# Patient Record
Sex: Female | Born: 2000
Health system: Southern US, Community
[De-identification: ages and names within clinical notes are randomized; demographics above are authoritative.]

## PROBLEM LIST (undated history)

## (undated) DIAGNOSIS — F329 Major depressive disorder, single episode, unspecified: Secondary | ICD-10-CM

## (undated) DIAGNOSIS — F32A Depression, unspecified: Secondary | ICD-10-CM

## (undated) DIAGNOSIS — F319 Bipolar disorder, unspecified: Secondary | ICD-10-CM

## (undated) DIAGNOSIS — F419 Anxiety disorder, unspecified: Secondary | ICD-10-CM

## (undated) DIAGNOSIS — Z7289 Other problems related to lifestyle: Secondary | ICD-10-CM

---
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
## 2021-02-22 IMAGING — MR MR HEAD W/O CM
12 of 13 series · 44 of 48 positions shown · non-contrast
Comparison: None available.

CLINICAL DATA: Initial evaluation for acute altered mental status.
Unclear cause.

EXAM:
MRI HEAD WITHOUT CONTRAST
TECHNIQUE: Multiplanar, multiecho pulse sequences of the brain and surrounding
structures were obtained without intravenous contrast.

[Series 5: DWI · axial · 3.0mm · 0.88mm/px · z∈[-82,+63]mm · 8 of 100 slices shown (1 of 4)]
[im 1/100]
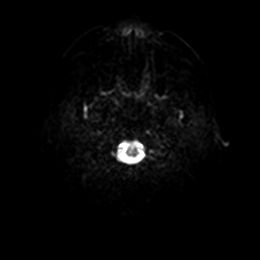
[im 15/100]
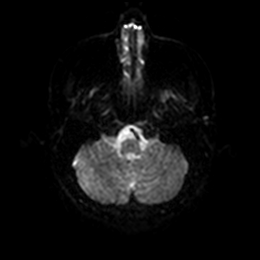
[im 29/100]
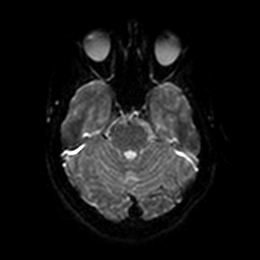
[im 43/100]
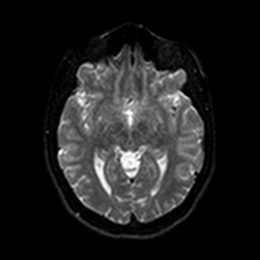
[im 57/100]
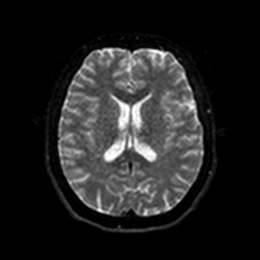
[im 71/100]
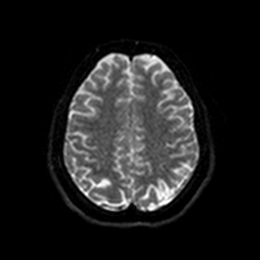
[im 85/100]
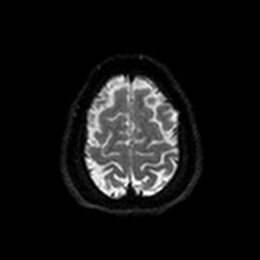
[im 100/100]
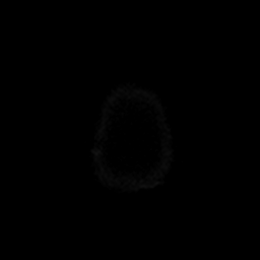

[Series 6: DWI · axial · 3.0mm · 0.88mm/px · z∈[-82,+63]mm · 4 of 50 slices shown (2 of 4)]
[im 1/50]
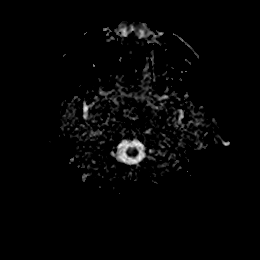
[im 17/50]
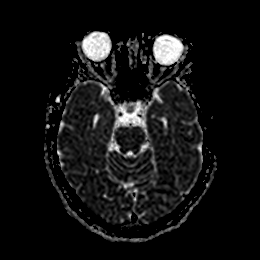
[im 33/50]
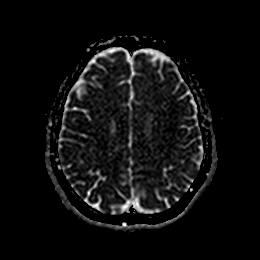
[im 50/50]
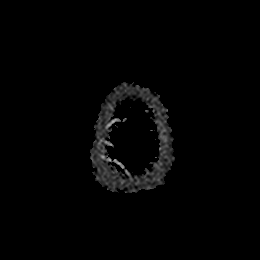

[Series 7: PD · axial · 4.0mm · 0.66mm/px · z∈[-84,+63]mm · 3 of 32 slices shown]
[im 1/32]
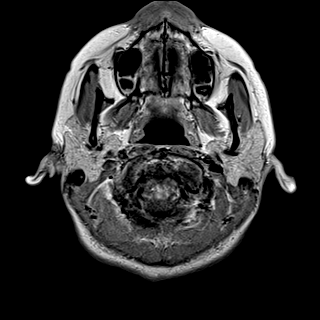
[im 16/32]
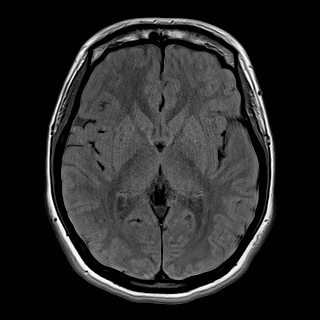
[im 32/32]
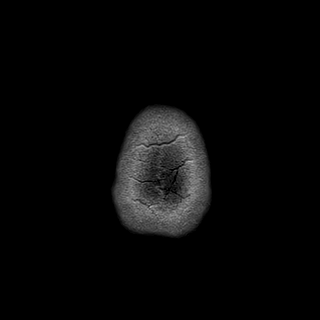

[Series 8: DWI · coronal · 4.0mm · 0.88mm/px · 6 of 72 slices shown (3 of 4)]
[im 1/72]
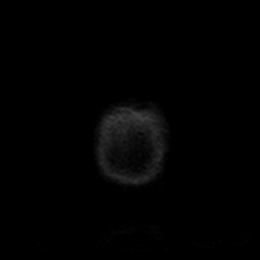
[im 15/72]
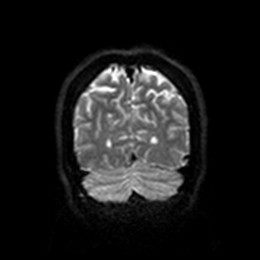
[im 29/72]
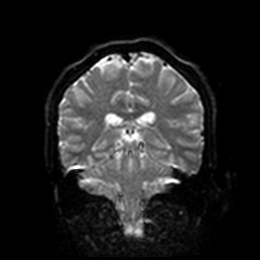
[im 43/72]
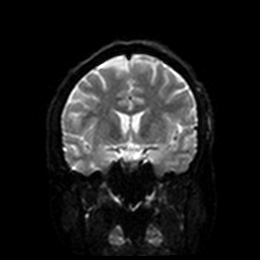
[im 57/72]
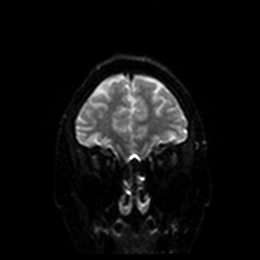
[im 72/72]
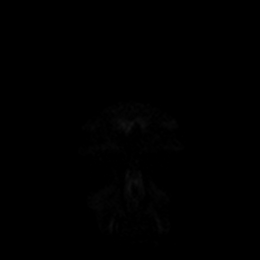

[Series 9: DWI · coronal · 4.0mm · 0.88mm/px · 3 of 36 slices shown (4 of 4)]
[im 1/36]
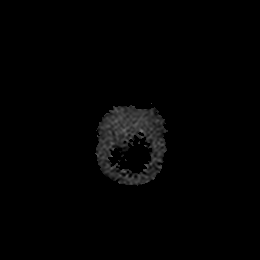
[im 18/36]
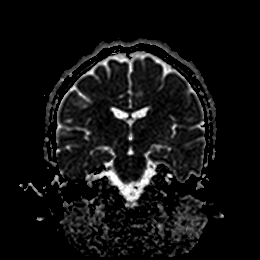
[im 36/36]
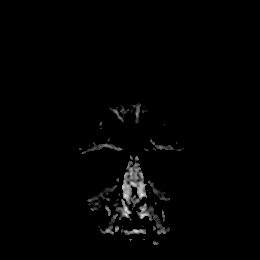

[Series 10: T1 · sagittal · 4.0mm · 0.75mm/px · 2 of 29 slices shown]
[im 1/29]
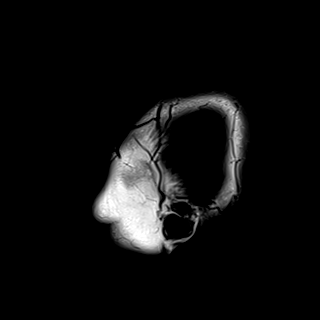
[im 29/29]
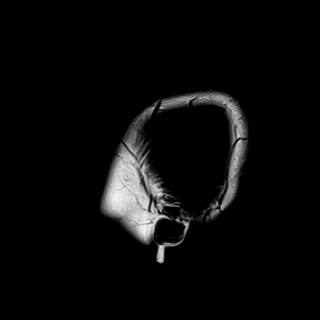

[Series 11: T2 · axial · 5.0mm · 0.72mm/px · z∈[-81,+62]mm · 2 of 25 slices shown (1 of 2)]
[im 1/25]
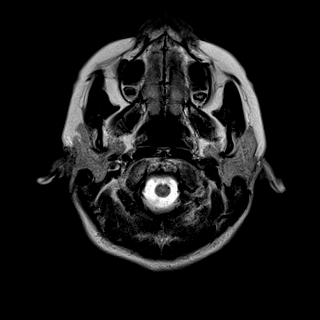
[im 25/25]
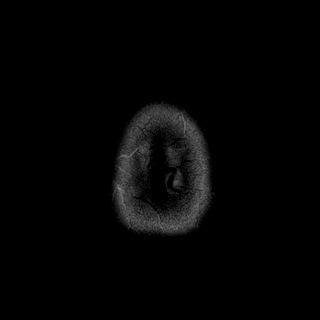

[Series 12: FLAIR · axial · 5.0mm · 0.45mm/px · z∈[-81,+61]mm · 2 of 25 slices shown]
[im 1/25]
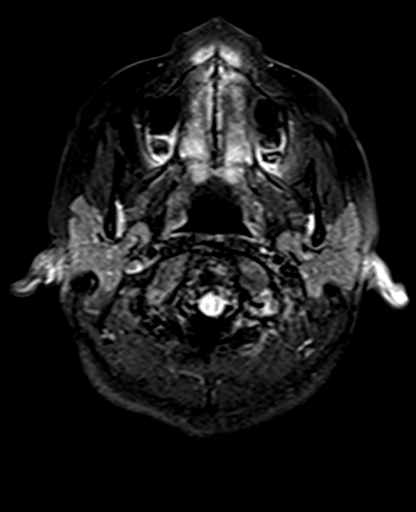
[im 25/25]
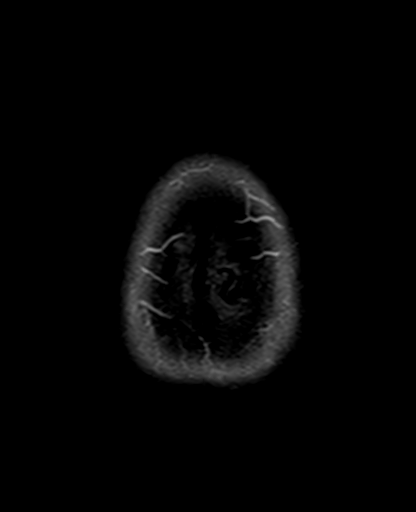

[Series 13: mag_images · axial · 3.0mm · 0.90mm/px · z∈[-86,+65]mm · 4 of 52 slices shown]
[im 1/52]
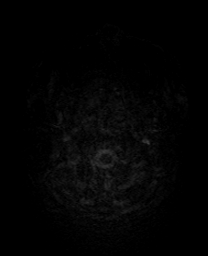
[im 18/52]
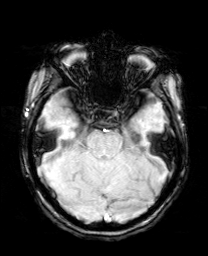
[im 35/52]
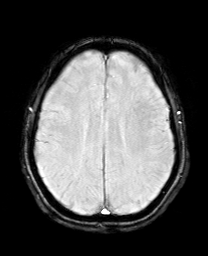
[im 52/52]
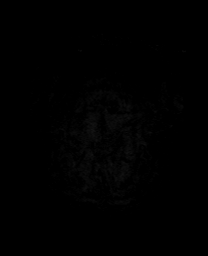

[Series 14: pha_images · axial · 3.0mm · 0.90mm/px · z∈[-83,+62]mm · 4 of 50 slices shown]
[im 1/50]
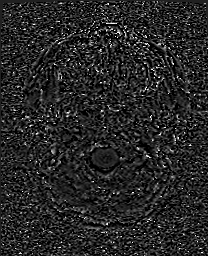
[im 17/50]
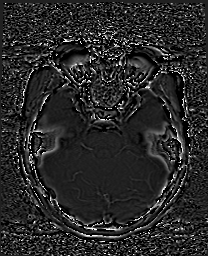
[im 33/50]
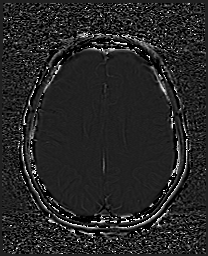
[im 50/50]
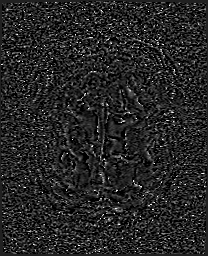

[Series 15: swi_images · axial · 3.0mm · 0.90mm/px · z∈[-86,+65]mm · 4 of 52 slices shown]
[im 1/52]
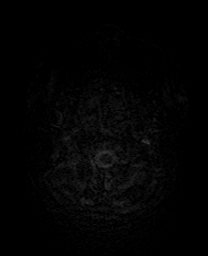
[im 18/52]
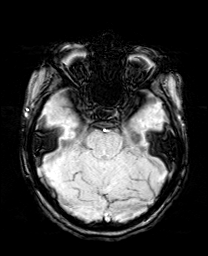
[im 35/52]
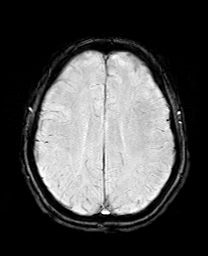
[im 52/52]
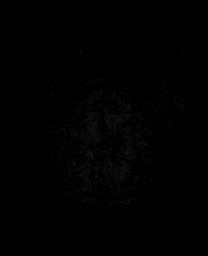

[Series 18: T2 · coronal · 5.0mm · 0.62mm/px · 2 of 29 slices shown (2 of 2)]
[im 1/29]
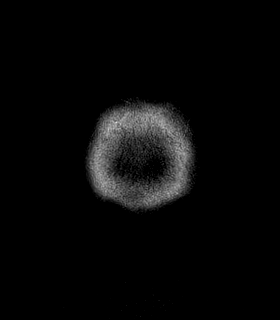
[im 29/29]
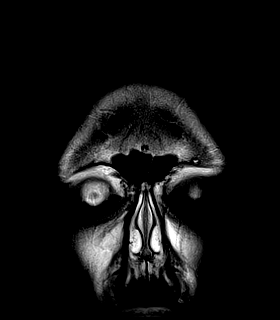

[44 of 48 positions shown; findings below may reference images not displayed]

FINDINGS: Brain: Cerebral volume within normal limits for patient age. No
focal parenchymal signal abnormality identified.

No abnormal foci of restricted diffusion to suggest acute or
subacute ischemia. Gray-white matter differentiation well
maintained. No encephalomalacia to suggest chronic infarction. No
foci of susceptibility artifact to suggest acute or chronic
intracranial hemorrhage.

No mass lesion, midline shift or mass effect. No hydrocephalus. No
extra-axial fluid collection. Major dural sinuses are grossly
patent.

Pituitary gland and suprasellar region are normal. Midline
structures intact and normal.

Vascular: Major intracranial vascular flow voids well maintained and
normal in appearance.

Skull and upper cervical spine: Craniocervical junction normal.
Visualized upper cervical spine within normal limits. Bone marrow
signal intensity normal. No scalp soft tissue abnormality.

Sinuses/Orbits: Globes and orbital soft tissues within normal
limits.

Paranasal sinuses are clear. No mastoid effusion. Inner ear
structures normal.

Other: None.
IMPRESSION: Normal brain MRI.  No acute intracranial abnormality.

## 2022-05-12 ENCOUNTER — Ambulatory Visit: Payer: Medicaid Other | Admitting: Occupational Therapy

## 2022-05-15 NOTE — Therapy (Incomplete)
OUTPATIENT OCCUPATIONAL THERAPY NEURO EVALUATION  Patient Name: Kara Nguyen MRN: [id number] DOB:2001-04-14, 21 y.o., female Today's Date: 05/15/2022  PCP: Marland Kitchen REFERRING PROVIDER: Juanda Chance     Past Medical History:  Diagnosis Date   Anxiety    Bipolar disorder [facility])    Deliberate self-cutting    Depression    No past surgical history on file. Patient Active Problem List   Diagnosis Date Noted   Abnormal movements 11/06/2019   Panic disorder 11/06/2019   Transient motor tic 11/06/2019   Vaginal discharge 04/22/2017   Anorexia nervosa, binge-eating purging type 08/27/2015   Oligomenorrhea 08/26/2015   Vitamin D deficiency 08/26/2015   Iron deficiency anemia 08/19/2015   History of self-harm 08/19/2015    ONSET DATE: ~02/10/2022   REFERRING DIAG: M25.531,M25.532 (ICD-10-CM) - Bilateral wrist pain   THERAPY DIAG:  No diagnosis found.  Rationale for Evaluation and Treatment: {HABREHAB:27488}  SUBJECTIVE:   SUBJECTIVE STATEMENT: *** Pt accompanied by: {accompnied:27141}  PERTINENT HISTORY: Per orthopedic note, "Eliannah Borowicz is a 21 y.o. female who presents today with complaints of bilateral hand pain. Patient reports this is somewhat of a chronic problem. No history of injury. She is right-hand dominant. She reports her hands bother her most when she is drawing. She denies any numbness or tingling. No particular night pain. Pain is somewhat poorly distributed by description. It does not localize well. She was seen by her primary care provider and had rheumatologic labs performed which were normal with the exception of mild elevation of rheumatoid factor and she has a rheumatologic consultation pending. Patient has other sites of musculoskeletal pain as well. She has had a referral placed for occupational therapy as well."   PRECAUTIONS: {Therapy precautions:24002}  WEIGHT BEARING RESTRICTIONS: {Yes ***/No:24003}  PAIN:  Are you having pain?  {OPRCPAIN:27236}  FALLS: Has patient fallen in last 6 months? {fallsyesno:27318}  LIVING ENVIRONMENT: Lives with: {OPRC lives with:25569::"lives with their family"} Lives in: {Lives in:25570} Stairs: {opstairs:27293} Has following equipment at home: {Assistive devices:23999}  PLOF: {PLOF:24004}  PATIENT GOALS: ***  OBJECTIVE:   HAND DOMINANCE: {MISC; OT HAND DOMINANCE:[id number]}  ADLs: Overall ADLs: *** Transfers/ambulation related to ADLs: Eating: *** Grooming: *** UB Dressing: *** LB Dressing: *** Toileting: *** Bathing: *** Tub Shower transfers: *** Equipment: {equipment:25573}  IADLs: Shopping: *** Light housekeeping: *** Meal Prep: *** Community mobility: *** Medication management: *** Financial management: *** Handwriting: {OTWRITTENEXPRESSION:25361}  MOBILITY STATUS: {OTMOBILITY:25360}  POSTURE COMMENTS:  {posture:25561} Sitting balance: {sitting balance:25483}  ACTIVITY TOLERANCE: Activity tolerance: ***  FUNCTIONAL OUTCOME MEASURES: {OTFUNCTIONALMEASURES:27238}  UPPER EXTREMITY ROM:    {AROM/PROM:27142} ROM Right eval Left eval  Shoulder flexion    Shoulder abduction    Shoulder adduction    Shoulder extension    Shoulder internal rotation    Shoulder external rotation    Elbow flexion    Elbow extension    Wrist flexion    Wrist extension    Wrist ulnar deviation    Wrist radial deviation    Wrist pronation    Wrist supination    (Blank rows = not tested)  UPPER EXTREMITY MMT:     MMT Right eval Left eval  Shoulder flexion    Shoulder abduction    Shoulder adduction    Shoulder extension    Shoulder internal rotation    Shoulder external rotation    Middle trapezius    Lower trapezius    Elbow flexion    Elbow extension    Wrist flexion  Wrist extension    Wrist ulnar deviation    Wrist radial deviation    Wrist pronation    Wrist supination    (Blank rows = not tested)  HAND  FUNCTION: {handfunction:27230}  COORDINATION: {otcoordination:27237}  SENSATION: {sensation:27233}  EDEMA: ***  MUSCLE TONE: {UETONE:25567}  COGNITION: Overall cognitive status: {cognition:24006}  VISION: Subjective report: *** Baseline vision: {OTBASELINEVISION:25363} Visual history: {OTVISUALHISTORY:25364}  VISION ASSESSMENT: {visionassessment:27231}  Patient has difficulty with following activities due to following visual impairments: ***  PERCEPTION: {Perception:25564}  PRAXIS: {Praxis:25565}  OBSERVATIONS: ***   TODAY'S TREATMENT:                                                                                                                              DATE: ***   PATIENT EDUCATION: Education details: *** Person educated: {Person educated:25204} Education method: {Education Method:25205} Education comprehension: {Education Comprehension:25206}  HOME EXERCISE PROGRAM: ***   GOALS: Goals reviewed with patient? {yes/no:20286}  SHORT TERM GOALS: Target date: ***  *** Baseline: Goal status: {GOALSTATUS:25110}  2.  *** Baseline:  Goal status: {GOALSTATUS:25110}  3.  *** Baseline:  Goal status: {GOALSTATUS:25110}  4.  *** Baseline:  Goal status: {GOALSTATUS:25110}  5.  *** Baseline:  Goal status: {GOALSTATUS:25110}  6.  *** Baseline:  Goal status: {GOALSTATUS:25110}  LONG TERM GOALS: Target date: ***  *** Baseline:  Goal status: {GOALSTATUS:25110}  2.  *** Baseline:  Goal status: {GOALSTATUS:25110}  3.  *** Baseline:  Goal status: {GOALSTATUS:25110}  4.  *** Baseline:  Goal status: {GOALSTATUS:25110}  5.  *** Baseline:  Goal status: {GOALSTATUS:25110}  6.  *** Baseline:  Goal status: {GOALSTATUS:25110}  ASSESSMENT:  CLINICAL IMPRESSION: Patient is a *** y.o. *** who was seen today for occupational therapy evaluation for ***.   PERFORMANCE DEFICITS: in functional skills including {OT physical skills:25468},  cognitive skills including {OT cognitive skills:25469}, and psychosocial skills including {OT psychosocial skills:25470}.   IMPAIRMENTS: are limiting patient from {OT performance deficits:25471}.   CO-MORBIDITIES: {Comorbidities:25485} that affects occupational performance. Patient will benefit from skilled OT to address above impairments and improve overall function.  MODIFICATION OR ASSISTANCE TO COMPLETE EVALUATION: {OT modification:25474}  OT OCCUPATIONAL PROFILE AND HISTORY: {OT PROFILE AND HISTORY:25484}  CLINICAL DECISION MAKING: {OT CDM:25475}  REHAB POTENTIAL: {rehabpotential:25112}  EVALUATION COMPLEXITY: {Evaluation complexity:25115}    PLAN:  OT FREQUENCY: {rehab frequency:25116}  OT DURATION: {rehab duration:25117}  PLANNED INTERVENTIONS: {OT Interventions:25467}  RECOMMENDED OTHER SERVICES: ***  CONSULTED AND AGREED WITH PLAN OF CARE: {IEP:32951}  PLAN FOR NEXT SESSION: Dennis Bast, OT 05/15/2022, 4:09 PM

## 2022-05-18 ENCOUNTER — Encounter: Payer: Medicaid Other | Admitting: Occupational Therapy

## 2022-05-18 ENCOUNTER — Ambulatory Visit: Payer: Medicaid Other | Attending: Physician Assistant | Admitting: Occupational Therapy

## 2022-05-18 ENCOUNTER — Encounter: Payer: Self-pay | Admitting: Occupational Therapy

## 2022-05-18 DIAGNOSIS — M25532 Pain in left wrist: Secondary | ICD-10-CM | POA: Diagnosis present

## 2022-05-18 DIAGNOSIS — M25531 Pain in right wrist: Secondary | ICD-10-CM | POA: Insufficient documentation

## 2022-05-18 NOTE — Therapy (Signed)
OUTPATIENT OCCUPATIONAL THERAPY ORTHO EVALUATION  Patient Name: Kara Nguyen MRN: [id number] DOB:Nov 09, 2000, 21 y.o., female Today's Date: 05/18/2022  PCP: Juanda Chance REFERRING PROVIDER: Mindi Curling, PA-C   OT End of Session - 05/18/22 1404     Visit Number 1    Number of Visits 1    Authorization Type [employer]    OT Start Time 9449    OT Stop Time 1350    OT Time Calculation (min) 45 min    Activity Tolerance Patient tolerated treatment well    Behavior During Therapy WFL for tasks assessed/performed             Past Medical History:  Diagnosis Date   Anxiety    Bipolar disorder ([place])    Deliberate self-cutting    Depression    History reviewed. No pertinent surgical history. Patient Active Problem List   Diagnosis Date Noted   Abnormal movements 11/06/2019   Panic disorder 11/06/2019   Transient motor tic 11/06/2019   Vaginal discharge 04/22/2017   Anorexia nervosa, binge-eating purging type 08/27/2015   Oligomenorrhea 08/26/2015   Vitamin D deficiency 08/26/2015   Iron deficiency anemia 08/19/2015   History of self-harm 08/19/2015    ONSET DATE: 05/04/2022  REFERRING DIAG: M25.531,M25.532 (ICD-10-CM) - Bilateral wrist pain  THERAPY DIAG:  Pain in right wrist  Pain in left wrist  Rationale for Evaluation and Treatment: Rehabilitation  SUBJECTIVE:   SUBJECTIVE STATEMENT: This began around August after editing on my phone and drawing a lot, however I feel like stress may have also contributed to this Pt accompanied by: family member  PERTINENT HISTORY: negative workup for nerve compressions, fx's, tendonitis however does have pending rheumatologic consult for mild elevation of rheumatoid factor. PMH: anxiety, bipolar, depression.  PRECAUTIONS: None  WEIGHT BEARING RESTRICTIONS: No  PAIN:  Are you having pain? Yes: NPRS scale: 0-6/10 in past 2 weeks, no pain reported today Pain location: base of thumbs/wrists Pain  description: sore/achy Aggravating factors: drawing, editing on phone Relieving factors: not using hands, ice  FALLS: Has patient fallen in last 6 months? 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  PATIENT GOALS: decrease pain in hands/wrists  NEXT MD VISIT:   OBJECTIVE:   HAND DOMINANCE: Right  ADLs: Overall ADLs: independent but pain with brushing hair  FUNCTIONAL OUTCOME MEASURES: Quick Dash: 13.6% impairment  UPPER EXTREMITY ROM:   BUE AROM WFL's including intrinsics, but noted laxity of joints in bilateral hands  Grip strength: Right: 55.7 lbs; Left: 48.0 lbs, Lateral pinch: Right: 15 lbs, Left: 14 lbs, and 3 point pinch: Right: 12 lbs, Left: 10 lbs  COORDINATION: 9 Hole Peg test: Right: 21.94 sec; Left: 26.16 sec  SENSATION: intact  EDEMA: none  COGNITION: Overall cognitive status: Within functional limits for tasks assessed. Pt however does ask multiple questions surrounding pain  OBSERVATIONS: Pt reports more stress lately which could be contributing to pain. Noted laxity of joints bilateral hands (suspect this is normal for her)   TODAY'S TREATMENT:  DATE: 05/18/22  Pt educated on positioning when writing/drawing including body positioning and positioning tablet in better position. Issued tan foam for built up pencil. Also recommended mechanical pencil as she would not have to press as hard. Discussed how laxity of joints is not uncommon for young women, but may make joints slightly less stable.   Recommended following up w/ rheumatologist to r/o any other factors, however do not see any current symptoms that are concerning.    PATIENT EDUCATION: See above  HOME EXERCISE PROGRAM: N/A  GOALS: Goals reviewed with patient? Yes   LONG  TERM GOALS: Target date:  05/18/22     Pt to verbalize understanding with positioning and A/E to reduce pain with drawing/writing Baseline:  Goal status: MET  ASSESSMENT:  CLINICAL IMPRESSION: Patient is a 21 y.o. female who was seen today for occupational therapy evaluation for bilateral wrist/hand pain which started around August after drawing and editing a lot. Pt scores WFL's for all tests today including grip and pinch strength, and coordination. Pt also reports pain is getting better. Pt was issued A/E for pencil and discussed positioning for pain reduction. No f/u O.T. recommended at this time  PERFORMANCE DEFICITS: in functional skills including pain, UE functional use.   IMPAIRMENTS: are limiting patient from leisure.   COMORBIDITIES: has no other co-morbidities that affects occupational performance. Patient will benefit from skilled OT to address above impairments and improve overall function.  MODIFICATION OR ASSISTANCE TO COMPLETE EVALUATION: Min-Moderate modification of tasks or assist with assess necessary to complete an evaluation.  OT OCCUPATIONAL PROFILE AND HISTORY: Problem focused assessment: Including review of records relating to presenting problem.  CLINICAL DECISION MAKING: LOW - limited treatment options, no task modification necessary  REHAB POTENTIAL: Good  EVALUATION COMPLEXITY: Low      PLAN:  OT FREQUENCY: one time visit    PLANNED INTERVENTIONS: coping strategies training and DME and/or AE instructions  RECOMMENDED OTHER SERVICES: Rheumatology  CONSULTED AND AGREED WITH PLAN OF CARE: Patient  PLAN FOR NEXT SESSION: N/A - No f/u O.T. recommended   Hans Eden, OT 05/18/2022, 2:05 PM

## 2022-10-30 ENCOUNTER — Ambulatory Visit (INDEPENDENT_AMBULATORY_CARE_PROVIDER_SITE_OTHER): Payer: Medicaid Other

## 2022-10-30 ENCOUNTER — Encounter (HOSPITAL_COMMUNITY): Payer: Self-pay

## 2022-10-30 ENCOUNTER — Ambulatory Visit (HOSPITAL_COMMUNITY)
Admission: EM | Admit: 2022-10-30 | Discharge: 2022-10-30 | Disposition: A | Discharge status: Home / Self Care | Payer: Medicaid Other | Attending: Urgent Care | Admitting: Urgent Care

## 2022-10-30 ENCOUNTER — Other Ambulatory Visit: Payer: Self-pay

## 2022-10-30 DIAGNOSIS — F419 Anxiety disorder, unspecified: Secondary | ICD-10-CM | POA: Insufficient documentation

## 2022-10-30 DIAGNOSIS — Z8659 Personal history of other mental and behavioral disorders: Secondary | ICD-10-CM | POA: Diagnosis not present

## 2022-10-30 DIAGNOSIS — R0789 Other chest pain: Secondary | ICD-10-CM | POA: Diagnosis not present

## 2022-10-30 DIAGNOSIS — Z1152 Encounter for screening for COVID-19: Secondary | ICD-10-CM | POA: Insufficient documentation

## 2022-10-30 DIAGNOSIS — R000 Tachycardia, unspecified: Secondary | ICD-10-CM | POA: Insufficient documentation

## 2022-10-30 MED ORDER — HYDROXYZINE HCL 25 MG PO TABS: ORAL_TABLET | ORAL | 0 refills | Status: DC

## 2022-10-30 NOTE — ED Triage Notes (Signed)
Pt presents with c/o lack of sleep x 2 days. States she has gone almost 72 hours without sleep.

## 2022-10-30 NOTE — ED Provider Notes (Signed)
[facility]   MRN: [id number] DOB: 31-Oct-2000  Subjective:   Kara Nguyen is a 22 y.o. female presenting for 2-day history of acute onset persistent anxiety, difficulty sleeping, racing thoughts.  Has also had chest pressure, runny and stuffy nose, malaise, fatigue.  Denies any shortness of breath, wheezing, body pains.  No throat pain, ear pain.   No smoking of any kind including cigarettes, cigars, vaping, marijuana use.  No drug use.  Has a past medical history of bipolar disorder, anxiety and depression.  Has previously taking mental health medications that she ended up stopping on her own due to not being able to tolerate them.  She is also had deliberate self cutting.  Denies any thoughts of suicidal ideation, thoughts to hurt or self.  She would like to get a referral for mental health.  Does not want to start any mental health medications today.  No current facility-administered medications for this encounter.  Current Outpatient Medications:    Fiber Select Gummies CHEW, Chew 2 tablets by mouth daily as needed (constipation). (Patient not taking: Reported on 05/18/2022), Disp: , Rfl:    OLANZapine (ZYPREXA) 5 MG tablet, Take 1 tablet (5 mg total) by mouth in the morning and at bedtime., Disp: 60 tablet, Rfl: 0   Allergies  Allergen Reactions   Amoxicillin Hives and Itching    Past Medical History:  Diagnosis Date   Anxiety    Bipolar disorder    Deliberate self-cutting    Depression      History reviewed. No pertinent surgical history.  Family History  Problem Relation Age of Onset   Cancer Other    Diabetes Paternal Grandmother     Social History   Tobacco Use   Smoking status: Never   Smokeless tobacco: Never  Vaping Use   Vaping Use: Never used  Substance Use Topics   Alcohol use: Never    Alcohol/week: 0.0 standard drinks of alcohol   Drug use: Not Currently    Types: Solvent inhalants    ROS   Objective:   Vitals: BP  119/83 (BP Location: Left Arm)   Pulse (!) 411   Temp 100 F (37.8 C) (Oral)   Resp 17   LMP 10/29/2022 (Exact Date)   SpO2 98%   Pulse recheck was 106-119.   Physical Exam Constitutional:      General: She is not in acute distress.    Appearance: Normal appearance. She is well-developed and normal weight. She is not ill-appearing, toxic-appearing or diaphoretic.  HENT:     Head: Normocephalic and atraumatic.     Right Ear: Tympanic membrane, ear canal and external ear normal. No drainage or tenderness. No middle ear effusion. There is no impacted cerumen. Tympanic membrane is not erythematous or bulging.     Left Ear: Tympanic membrane, ear canal and external ear normal. No drainage or tenderness.  No middle ear effusion. There is no impacted cerumen. Tympanic membrane is not erythematous or bulging.     Nose: Nose normal. No congestion or rhinorrhea.     Mouth/Throat:     Mouth: Mucous membranes are moist. No oral lesions.     Pharynx: No pharyngeal swelling, oropharyngeal exudate, posterior oropharyngeal erythema or uvula swelling.     Tonsils: No tonsillar exudate or tonsillar abscesses.  Eyes:     General: No scleral icterus.       Right eye: No discharge.        Left eye: No discharge.  Extraocular Movements: Extraocular movements intact.     Right eye: Normal extraocular motion.     Left eye: Normal extraocular motion.     Conjunctiva/sclera: Conjunctivae normal.  Cardiovascular:     Rate and Rhythm: Normal rate and regular rhythm.     Heart sounds: Normal heart sounds. No murmur heard.    No friction rub. No gallop.  Pulmonary:     Effort: Pulmonary effort is normal. No respiratory distress.     Breath sounds: No stridor. No wheezing, rhonchi or rales.  Chest:     Chest wall: No tenderness.  Musculoskeletal:     Cervical back: Normal range of motion and neck supple.  Lymphadenopathy:     Cervical: No cervical adenopathy.  Skin:    General: Skin is warm and dry.   Neurological:     General: No focal deficit present.     Mental Status: She is alert and oriented to person, place, and time.     Cranial Nerves: No cranial nerve deficit.     Motor: No weakness.     Coordination: Coordination normal.     Gait: Gait normal.  Psychiatric:        Mood and Affect: Mood normal.        Behavior: Behavior normal.    ED ECG REPORT   Date: 10/30/2022  Rate: 95bpm  Rhythm: normal sinus rhythm  QRS Axis: normal  Intervals: normal  ST/T Wave abnormalities: nonspecific T wave changes  Conduction Disutrbances:none  Narrative Interpretation: Sinus rhythm with unspecified sinus arrhythmia at 95 bpm, nonspecific T wave flattening in III, aVF  Old EKG Reviewed: unchanged  I have personally reviewed the EKG tracing and agree with the computerized printout as noted.  DG Chest 2 View  Result Date: 10/30/2022 CLINICAL DATA:  Chest pressure. EXAM: CHEST - 2 VIEW COMPARISON:  None Available. FINDINGS: The heart size and mediastinal contours are within normal limits. Both lungs are clear. No visible pleural effusions or pneumothorax. No acute osseous abnormality. IMPRESSION: No active cardiopulmonary disease. Electronically Signed   By: Feliberto Harts M.D.   On: 10/30/2022 16:42     Assessment and Plan :   PDMP not reviewed this encounter.  1. Chest pressure   2. Tachycardia, unspecified   3. Anxiety   4. History of depression   5. History of bipolar disorder     Patient's priority was to get a medication to help her with sleep.  She has a significant mental health history and therefore I placed a referral to behavioral health.  Given her fever, respiratory symptoms recommended COVID testing.  Use general supportive care. Counseled patient on potential for adverse effects with medications prescribed/recommended today, ER and return-to-clinic precautions discussed, patient verbalized understanding.    Wallis Bamberg, [place] 10/30/22 1646

## 2022-10-30 NOTE — Discharge Instructions (Addendum)
We will notify you of your test results as they arrive and may take between about 24 hours.  I encourage you to sign up for MyChart if you have not already done so as this can be the easiest way for [place] to communicate results to you online or through a phone app.  Generally, we only contact you if it is a positive test result.  In the meantime, if you develop worsening symptoms including fever, chest pain, shortness of breath despite our current treatment plan then please report to the emergency room as this may be a sign of worsening status from possible viral infection.  Otherwise, we will manage this as a viral syndrome. For sore throat or cough try using a honey-based tea. Use 3 teaspoons of honey with juice squeezed from half lemon. Place shaved pieces of ginger into 1/2-1 cup of water and warm over stove top. Then mix the ingredients and repeat every 4 hours as needed. Please take Tylenol -  every 6 hours for aches and pains, fevers. Hydrate very well with at least 2 liters of water. Eat light meals such as soups to replenish electrolytes and soft fruits, veggies. Start an antihistamine like Zyrtec (  daily) for postnasal drainage, sinus congestion.  You can use the cough medications as needed.

## 2022-10-31 ENCOUNTER — Ambulatory Visit (HOSPITAL_COMMUNITY)
Admission: EM | Admit: 2022-10-31 | Discharge: 2022-10-31 | Disposition: A | Discharge status: Home / Self Care | Payer: Medicaid Other | Attending: Urology | Admitting: Urology

## 2022-10-31 DIAGNOSIS — F419 Anxiety disorder, unspecified: Secondary | ICD-10-CM | POA: Insufficient documentation

## 2022-10-31 DIAGNOSIS — G479 Sleep disorder, unspecified: Secondary | ICD-10-CM | POA: Insufficient documentation

## 2022-10-31 DIAGNOSIS — F319 Bipolar disorder, unspecified: Secondary | ICD-10-CM | POA: Insufficient documentation

## 2022-10-31 LAB — SARS CORONAVIRUS 2 (TAT 6-24 HRS): SARS Coronavirus 2: NEGATIVE

## 2022-10-31 NOTE — ED Triage Notes (Signed)
Pt presents to [facility] voluntarily. Pt reports not sleeping the past two days. Pt reports stress and anxiety has kept her up. Pt reports thinking about next steps in her life have caused extreme stress. Pt reports endorsing SI with no plan. Pt denies HI and AVH. Pt is routine.

## 2022-10-31 NOTE — Discharge Instructions (Addendum)
Patient does not wish to be restarted on mental health medication at this time. If you continue to experience sleep difficulties, you may take over the counter sleep-aid such as melatonin.   Discharge recommendations:  Patient is to take medications as prescribed. Please see information for follow-up appointment with psychiatry and therapy. Please follow up with your primary care provider for all medical related needs.   Therapy: We recommend that patient participate in individual therapy to address mental health concerns.  Medications: The patient or guardian is to contact a medical professional and/or outpatient provider to address any new side effects that develop. The patient or guardian should update outpatient providers of any new medications and/or medication changes.   Atypical antipsychotics: If you are prescribed an atypical antipsychotic, it is recommended that your height, weight, BMI, blood pressure, fasting lipid panel, and fasting blood sugar be monitored by your outpatient providers.  Safety:  The patient should abstain from use of illicit substances/drugs and abuse of any medications. If symptoms worsen or do not continue to improve or if the patient becomes actively suicidal or homicidal then it is recommended that the patient return to the closest hospital emergency department, the [facility], or call 911 for further evaluation and treatment. National Suicide Prevention Lifeline 1-800-SUICIDE or 281-125-6936.  About 988 988 offers 24/7 access to trained crisis counselors who can help people experiencing mental health-related distress. People can call or text 988 or chat 988lifeline.org for themselves or if they are worried about a loved one who may need crisis support.  Crisis Mobile: [facility]:                     228-709-4597 (for crisis response 24 hours a day) [facility] Hotline:                                             (562) 558-8134

## 2022-10-31 NOTE — ED Notes (Signed)
Patient given AVS - verbalizes understanding - patient and parents walked out to the lobby - patient in no sxs of distress noted

## 2022-10-31 NOTE — ED Provider Notes (Incomplete)
Behavioral Health Urgent Care Medical Screening Exam  Patient Name: Kara Nguyen MRN: [id number] Date of Evaluation: 10/31/22 Chief Complaint:   Diagnosis:  Final diagnoses:  Bipolar I disorder    History of Present illness: Kara Nguyen is a 22 y.o. female. ***  Flowsheet Row ED from 10/31/2022 in [facility] ED from 10/30/2022 in [facility] ED to Hosp-Admission (Discharged) from 11/06/2019 in [facility] PEDI[facility]  C-SSRS RISK CATEGORY No Risk No Risk High Risk       Psychiatric Specialty Exam  Presentation  General Appearance:Appropriate for Environment  Eye Contact:Fleeting  Speech:Clear and Coherent  Speech Volume:Normal  Handedness:Right   Mood and Affect  Mood: Anxious  Affect: Congruent   Thought Process  Thought Processes: Linear  Descriptions of Associations:Loose  Orientation:Full (Time, Place and Person)  Thought Content:WDL    Hallucinations:None  Ideas of Reference:None  Suicidal Thoughts:No  Homicidal Thoughts:No   Sensorium  Memory: Immediate Good; Recent Fair; Remote Fair  Judgment: Fair  Insight: Fair   Art therapist  Concentration: Poor  Attention Span: Poor  Recall: Fair  Fund of Knowledge: Fair  Language: Fair   Psychomotor Activity  Psychomotor Activity: Normal   Assets  Assets: Desire for Improvement; Social Support; Physical Health; Housing   Sleep  Sleep: Poor (patient say she has not slept in 2 days)  Number of hours: No data recorded  Physical Exam: Physical Exam ROS Blood pressure (!) 145/102, pulse (!) 104, temperature 99 F (37.2 C), temperature source Oral, resp. rate 18, last menstrual period 10/29/2022, SpO2 100 %. There is no height or weight on file to calculate BMI.  Musculoskeletal: Strength & Muscle Tone: {desc; muscle tone:32375} Gait & Station: {PE GAIT ED NATL:22525} Patient  leans: {Patient Leans:21022755}   [facility] MSE Discharge Disposition for Follow up and Recommendations: {[facility] MSE Recommendations:24277}   Destine Zirkle A Rashema Seawright, NP 10/31/2022, 6:16 AM

## 2022-11-01 ENCOUNTER — Encounter (HOSPITAL_COMMUNITY): Payer: Self-pay

## 2022-11-01 ENCOUNTER — Other Ambulatory Visit: Payer: Self-pay

## 2022-11-01 ENCOUNTER — Emergency Department (HOSPITAL_COMMUNITY)
Admission: EM | Admit: 2022-11-01 | Discharge: 2022-11-03 | Disposition: A | Discharge status: Other Acute Inpatient Hospital | Payer: Medicaid Other | Attending: Emergency Medicine | Admitting: Emergency Medicine

## 2022-11-01 DIAGNOSIS — D72829 Elevated white blood cell count, unspecified: Secondary | ICD-10-CM | POA: Diagnosis not present

## 2022-11-01 DIAGNOSIS — F23 Brief psychotic disorder: Secondary | ICD-10-CM | POA: Diagnosis not present

## 2022-11-01 DIAGNOSIS — D75839 Thrombocytosis, unspecified: Secondary | ICD-10-CM | POA: Insufficient documentation

## 2022-11-01 DIAGNOSIS — F309 Manic episode, unspecified: Secondary | ICD-10-CM | POA: Insufficient documentation

## 2022-11-01 DIAGNOSIS — F319 Bipolar disorder, unspecified: Secondary | ICD-10-CM | POA: Diagnosis not present

## 2022-11-01 LAB — CBC WITH DIFFERENTIAL/PLATELET
Abs Immature Granulocytes: 0.04 10*3/uL (ref 0.00–0.07)
Basophils Absolute: 0.1 10*3/uL (ref 0.0–0.1)
Basophils Relative: 0 %
Eosinophils Absolute: 0.2 10*3/uL (ref 0.0–0.5)
Eosinophils Relative: 1 %
HCT: 39.3 % (ref 36.0–46.0)
Hemoglobin: 12.8 g/dL (ref 12.0–15.0)
Immature Granulocytes: 0 %
Lymphocytes Relative: 22 %
Lymphs Abs: 3.1 10*3/uL (ref 0.7–4.0)
MCH: 29.1 pg (ref 26.0–34.0)
MCHC: 32.6 g/dL (ref 30.0–36.0)
MCV: 89.3 fL (ref 80.0–100.0)
Monocytes Absolute: 1 10*3/uL (ref 0.1–1.0)
Monocytes Relative: 7 %
Neutro Abs: 9.5 10*3/uL — ABNORMAL HIGH (ref 1.7–7.7)
Neutrophils Relative %: 70 %
Platelets: 427 10*3/uL — ABNORMAL HIGH (ref 150–400)
RBC: 4.4 MIL/uL (ref 3.87–5.11)
RDW: 13.4 % (ref 11.5–15.5)
WBC: 13.9 10*3/uL — ABNORMAL HIGH (ref 4.0–10.5)
nRBC: 0 % (ref 0.0–0.2)

## 2022-11-01 LAB — COMPREHENSIVE METABOLIC PANEL
ALT: 16 U/L (ref 0–44)
AST: 23 U/L (ref 15–41)
Albumin: 4.3 g/dL (ref 3.5–5.0)
Alkaline Phosphatase: 80 U/L (ref 38–126)
Anion gap: 10 (ref 5–15)
BUN: 9 mg/dL (ref 6–20)
CO2: 23 mmol/L (ref 22–32)
Calcium: 9.3 mg/dL (ref 8.9–10.3)
Chloride: 106 mmol/L (ref 98–111)
Creatinine, Ser: 0.62 mg/dL (ref 0.44–1.00)
GFR, Estimated: >60 mL/min (ref >60)
Glucose, Bld: 95 mg/dL (ref 70–99)
Potassium: 4 mmol/L (ref 3.5–5.1)
Sodium: 139 mmol/L (ref 135–145)
Total Bilirubin: 0.5 mg/dL (ref 0.3–1.2)
Total Protein: 8.4 g/dL — ABNORMAL HIGH (ref 6.5–8.1)

## 2022-11-01 LAB — HCG, QUANTITATIVE, PREGNANCY: hCG, Beta Chain, Quant, S: <1 m[IU]/mL (ref <5)

## 2022-11-01 LAB — RAPID URINE DRUG SCREEN, HOSP PERFORMED
Amphetamines: NOT DETECTED
Barbiturates: NOT DETECTED
Benzodiazepines: NOT DETECTED
Cocaine: NOT DETECTED
Opiates: NOT DETECTED
Tetrahydrocannabinol: NOT DETECTED

## 2022-11-01 LAB — ETHANOL: Alcohol, Ethyl (B): <10 mg/dL (ref <10)

## 2022-11-01 MED ORDER — ACETAMINOPHEN 500 MG PO TABS: 500.0000 mg | ORAL_TABLET | Freq: Four times a day (QID) | ORAL | Status: DC | PRN

## 2022-11-01 MED ORDER — OLANZAPINE 5 MG PO TABS
5.0000 mg | ORAL_TABLET | Freq: Two times a day (BID) | ORAL | Status: DC
  Administered 2022-11-01 – 2022-11-03 (×4): 5 mg via ORAL
  Filled 2022-11-01 (×4): qty 1

## 2022-11-01 MED ORDER — HYDROXYZINE HCL 25 MG PO TABS
25.0000 mg | ORAL_TABLET | Freq: Three times a day (TID) | ORAL | Status: DC | PRN
  Administered 2022-11-02 – 2022-11-03 (×3): 25 mg via ORAL
  Filled 2022-11-01 (×4): qty 1

## 2022-11-01 MED ORDER — LORAZEPAM 1 MG PO TABS
1.0000 mg | ORAL_TABLET | Freq: Once | ORAL | Status: AC | PRN
  Administered 2022-11-01: 1 mg via ORAL
  Filled 2022-11-01: qty 1

## 2022-11-01 NOTE — ED Notes (Signed)
TTS at bedside. 

## 2022-11-01 NOTE — ED Notes (Signed)
Pt given water 

## 2022-11-01 NOTE — Consult Note (Signed)
[facility] ED ASSESSMENT   Reason for Consult:  manic episode Referring Physician:  Sherian Maroon, PA-C Patient Identification: Kara Nguyen MRN:  [id number] ED Chief Complaint: Bipolar disorder  Diagnosis:  Principal Problem:   Bipolar disorder Active Problems:   Mania   ED Assessment Time Calculation: Start Time: 1240 Stop Time: 1325 Total Time in Minutes (Assessment Completion): 45   HPI: Per Triage Note: Pt saying alright when questions asked in triage, hitting the wall in triage, laughing.  Pt went to [facility] yesterday.  Stopped meds on her own due to unable to tolerate them.  Hx of cutting.  Hx of bipolar, anxiety, and depression.    Subjective: Kara Nguyen, 22 y.o., female patient seen face to face by this provider, consulted with Dr. Lucianne Muss; and chart reviewed on 11/01/22.  On evaluation Kara Nguyen reports that she has been having racing thoughts, says she had these thoughts for 4 days.  She states some of these racing thoughts are coming from life stressors, states she does not know what she wants to do or be in life.  Provider informed her that she is young enough to where she does not have to rush to think about what she wants to do in life.  When provider asked patient if she socializes with other people her age, she stated no she states that she does not have any friends and she just stays in the house.  During assessment patient is bizarrely responding slowly to questions, as if she is responding to internal stimuli, questions have to be repeated, asked patient if he needed a interpreter, she shook her head no.  Patient says that she is having some racing thoughts, but unable to tell provider what is triggering the racing thoughts.  Patient denies using any illicit drugs or alcohol, UDS is negative, BAL less than 10.  During evaluation Kara Nguyen is laying in the hospital gurney on her side facing provider in no acute distress. She is alert, oriented x 3,  calm, and cooperative.  Patient has a blank stare, provider has to repeat questions, unsure if patient is responding to internal stimuli when asked she says no.  Throughout assessment patient takes her right arm and jiggles it and moves around, she states this helps her when she is extremely anxious. Her mood is calm with congruent/bizarre affect.  She has normal speech volume, but patient has delayed speech, and behavior.  She denies suicidal/self-harm/homicidal ideation, psychosis, and paranoia.    Spoke with patient's brother Minerva Areola, patient's mother Suzanne Boron, and father Ardyth Harps, and they stated that patient has not slept well recently, slept a little last night but before that had not slept in a couple days.  They state that she stays in her room, and is only on her phone on the Internet, they feel that she is responding inappropriate to guys, they feel that she may be "stalking" men that she knows and likes.  Patient has no socialization with friends she sits around on the phone or she draws her art work all day in her room she received her GED through online homeschooling, due to the stress of the peers at school.  States that patient did stop taking her medications do not know when she stopped, but states that she did not like the way they made her feel.  They stated that patient does not use any illicit drugs or alcohol, and no vaping that they know of.  She also does not drive.  They feel that  she sits in her room and just allows her mind to race, and they do not know how to help her Per chart review patient has had several admissions to the emergency department, she has been to behavioral [facility] 2-3 years ago.  Past Psychiatric History: Bipolar 1  Risk to Self: Yes Risk to Others:  No  Prior Inpatient Therapy:  Yes  Prior Outpatient Therapy:  No    [place] Scale:  Flowsheet Row ED from 11/01/2022 in [facility] Emergency Department at [facility] ED from 10/31/2022 in  [facility] ED from 10/30/2022 in [facility] RISK CATEGORY No Risk No Risk No Risk       AIMS:  , , ,  ,   ASAM:    Substance Abuse:     Past Medical History:  Past Medical History:  Diagnosis Date   Anxiety    Bipolar disorder    Deliberate self-cutting    Depression    History reviewed. No pertinent surgical history. Family History:  Family History  Problem Relation Age of Onset   Cancer Other    Diabetes Paternal Grandmother     Social History:  Social History   Substance and Sexual Activity  Alcohol Use Never   Alcohol/week: 0.0 standard drinks of alcohol     Social History   Substance and Sexual Activity  Drug Use Not Currently   Types: Solvent inhalants    Social History   Socioeconomic History   Marital status: Single    Spouse name: Not on file   Number of children: 0   Years of education: Not on file   Highest education level: 10th grade  Occupational History   Occupation: Consulting civil engineer  Tobacco Use   Smoking status: Never   Smokeless tobacco: Never  Vaping Use   Vaping Use: Never used  Substance and Sexual Activity   Alcohol use: Never    Alcohol/week: 0.0 standard drinks of alcohol   Drug use: Not Currently    Types: Solvent inhalants   Sexual activity: Never  Other Topics Concern   Not on file  Social History Narrative   Pt is an 11th grader at [employer].  She lives with mother, father, and siblings.  Pt is followed by [facility] outpatient psychiatry for treatment of Bipolar I.   Social Determinants of Health   Financial Resource Strain: Not on file  Food Insecurity: Not on file  Transportation Needs: Not on file  Physical Activity: Not on file  Stress: Not on file  Social Connections: Not on file      Allergies:   Allergies  Allergen Reactions   Amoxicillin Hives and Itching    Labs:  Results for orders placed or performed during the hospital  encounter of 11/01/22 (from the past 48 hour(s))  Comprehensive metabolic panel     Status: Abnormal   Collection Time: 11/01/22 11:18 AM  Result Value Ref Range   Sodium 139 135 - 145 mmol/L   Potassium 4.0 3.5 - 5.1 mmol/L   Chloride 106 98 - 111 mmol/L   CO2 23 22 - 32 mmol/L   Glucose, Bld 95 70 - 99 mg/dL    Comment: Glucose reference range applies only to samples taken after fasting for at least 8 hours.   BUN 9 6 - 20 mg/dL   Creatinine, Ser 1.61 0.44 - 1.00 mg/dL   Calcium 9.3 8.9 - 09.6 mg/dL   Total Protein  8.4 (H) 6.5 - 8.1 g/dL   Albumin 4.3 3.5 - 5.0 g/dL   AST 23 15 - 41 U/L   ALT 16 0 - 44 U/L   Alkaline Phosphatase 80 38 - 126 U/L   Total Bilirubin 0.5 0.3 - 1.2 mg/dL   GFR, Estimated >96 >04 mL/min    Comment: (NOTE) Calculated using the CKD-EPI Creatinine Equation (2021)    Anion gap 10 5 - 15    Comment: Performed at [facility], [address], [place], [place] [address]  Ethanol     Status: None   Collection Time: 11/01/22 11:18 AM  Result Value Ref Range   Alcohol, Ethyl (B) <10 <10 mg/dL    Comment: (NOTE) Lowest detectable limit for serum alcohol is 10 mg/dL.  For medical purposes only. Performed at [facility], [address] [place], [place] [address]   CBC with Diff     Status: Abnormal   Collection Time: 11/01/22 11:18 AM  Result Value Ref Range   WBC 13.9 (H) 4.0 - 10.5 K/uL   RBC 4.40 3.87 - 5.11 MIL/uL   Hemoglobin 12.8 12.0 - 15.0 g/dL   HCT 78.2 95.6 - 21.3 %   MCV 89.3 80.0 - 100.0 fL   MCH 29.1 26.0 - 34.0 pg   MCHC 32.6 30.0 - 36.0 g/dL   RDW 08.6 57.8 - 46.9 %   Platelets 427 (H) 150 - 400 K/uL   nRBC 0.0 0.0 - 0.2 %   Neutrophils Relative % 70 %   Neutro Abs 9.5 (H) 1.7 - 7.7 K/uL   Lymphocytes Relative 22 %   Lymphs Abs 3.1 0.7 - 4.0 K/uL   Monocytes Relative 7 %   Monocytes Absolute 1.0 0.1 - 1.0 K/uL   Eosinophils Relative 1 %   Eosinophils Absolute 0.2 0.0 - 0.5 K/uL   Basophils  Relative 0 %   Basophils Absolute 0.1 0.0 - 0.1 K/uL   Immature Granulocytes 0 %   Abs Immature Granulocytes 0.04 0.00 - 0.07 K/uL    Comment: Performed at [facility], [address] [place], [place] [address]  hCG, quantitative, pregnancy     Status: None   Collection Time: 11/01/22 11:21 AM  Result Value Ref Range   hCG, Beta Chain, Quant, S <1 <5 mIU/mL    Comment:          GEST. AGE      CONC.  (mIU/mL)   <=1 WEEK        5 - 50     2 WEEKS       50 - 500     3 WEEKS       100 - 10,000     4 WEEKS     1,000 - 30,000     5 WEEKS     3,500 - 115,000   6-8 WEEKS     12,000 - 270,000    12 WEEKS     15,000 - 220,000        FEMALE AND NON-PREGNANT FEMALE:     LESS THAN 5 mIU/mL Performed at [facility], [address], [place], [place] [address]   Urine rapid drug screen (hosp performed)     Status: None   Collection Time: 11/01/22  2:39 PM  Result Value Ref Range   Opiates NONE DETECTED NONE DETECTED   Cocaine NONE DETECTED NONE DETECTED   Benzodiazepines NONE DETECTED NONE DETECTED   Amphetamines  NONE DETECTED NONE DETECTED   Tetrahydrocannabinol NONE DETECTED NONE DETECTED   Barbiturates NONE DETECTED NONE DETECTED    Comment: (NOTE) DRUG SCREEN FOR MEDICAL PURPOSES ONLY.  IF CONFIRMATION IS NEEDED FOR ANY PURPOSE, NOTIFY LAB WITHIN 5 DAYS.  LOWEST DETECTABLE LIMITS FOR URINE DRUG SCREEN Drug Class                     Cutoff (ng/mL) Amphetamine and metabolites    1000 Barbiturate and metabolites    200 Benzodiazepine                 200 Opiates and metabolites        300 Cocaine and metabolites        300 THC                            50 Performed at [facility], [address], [place], [place] [address]     Current Facility-Administered Medications  Medication Dose Route Frequency Provider Last Rate Last Admin   acetaminophen (TYLENOL) tablet 500 mg  500 mg Oral Q6H PRN Sherian Maroon A, PA        hydrOXYzine (ATARAX) tablet 25 mg  25 mg Oral TID PRN Sherian Maroon A, PA       LORazepam (ATIVAN) tablet 1 mg  1 mg Oral Once PRN Motley-Mangrum, Ashe Gago A, PMHNP       OLANZapine (ZYPREXA) tablet 5 mg  5 mg Oral BID Motley-Mangrum, Hester Forget A, PMHNP       Current Outpatient Medications  Medication Sig Dispense Refill   Fiber Select Gummies CHEW Chew 2 tablets by mouth daily as needed (constipation). (Patient not taking: Reported on 05/18/2022)     hydrOXYzine (ATARAX) 25 MG tablet Take 1 tablet at bedtime as needed for insomnia. 30 tablet 0   OLANZapine (ZYPREXA) 5 MG tablet Take 1 tablet (5 mg total) by mouth in the morning and at bedtime. 60 tablet 0    Musculoskeletal: Strength & Muscle Tone: within normal limits Gait & Station: normal Patient leans: N/A   Psychiatric Specialty Exam: Presentation  General Appearance:  Appropriate for Environment; Bizarre  Eye Contact: Fleeting  Speech: Clear and Coherent  Speech Volume: Normal  Handedness: Right   Mood and Affect  Mood: Anxious  Affect: Congruent   Thought Process  Thought Processes: Linear  Descriptions of Associations:Loose  Orientation:Full (Time, Place and Person)  Thought Content:WDL  History of Schizophrenia/Schizoaffective disorder:No data recorded Duration of Psychotic Symptoms:No data recorded Hallucinations:Hallucinations: None  Ideas of Reference:None  Suicidal Thoughts:Suicidal Thoughts: No  Homicidal Thoughts:Homicidal Thoughts: No   Sensorium  Memory: Immediate Good; Recent Good  Judgment: Fair  Insight: Fair   Art therapist  Concentration: Poor  Attention Span: Poor  Recall: [employer] of Knowledge: Fair  Language: Fair   Psychomotor Activity  Psychomotor Activity: Psychomotor Activity: Normal   Assets  Assets: Desire for Improvement; Manufacturing systems engineer; Housing; Social Support    Sleep  Sleep: Sleep: Poor   Physical Exam: Physical  Exam Pulmonary:     Effort: Pulmonary effort is normal.  Musculoskeletal:        General: Normal range of motion.  Neurological:     Mental Status: She is alert.  Psychiatric:        Mood and Affect: Mood is anxious. Affect is flat.        Speech: Speech is delayed.  Behavior: Behavior is cooperative.        Thought Content: Thought content is delusional.        Cognition and Memory: Memory is impaired.     Comments: Patient paid partial attention to provider during evaluation, patient acting bizarrely.    Review of Systems  Constitutional: Negative.   HENT: Negative.    Respiratory: Negative.    Psychiatric/Behavioral:  Positive for depression and hallucinations. The patient has insomnia.    Blood pressure (!) 152/97, pulse (!) 107, temperature 99 F (37.2 C), temperature source Oral, resp. rate 18, height  (1.702 m), weight 78.9 kg, last menstrual period 10/29/2022, SpO2 100 %. Body mass index is 27.24 kg/m.   Medical Decision Making: Patient case review and discussed with Dr. Lucianne Muss. Patient needs inpatient psychiatric admission for stabilization and treatment.  Will continue her home medications Zyprexa and Atarax.    Disposition: Recommend psychiatric Inpatient admission when medically cleared.   Alona Bene, PMHNP 11/01/2022 5:16 PM

## 2022-11-01 NOTE — ED Notes (Signed)
ED Provider at bedside. 

## 2022-11-01 NOTE — ED Notes (Signed)
Straight stick for bloodwork, sent to lab.

## 2022-11-01 NOTE — ED Provider Notes (Signed)
[place] EMERGENCY DEPARTMENT AT [facility] Provider Note   CSN: [id number] Arrival date & time: 11/01/22  1031     History  Chief Complaint  Patient presents with   Psychiatric Evaluation    Kara Nguyen is a 22 y.o. female.  HPI   22 year old female presents emergency department with complaints of racing thoughts, excessively religious thoughts, increased life stress, feelings of "manic episode."  Patient reports history of bipolar 1 disorder and states she has not been on any medication for said symptoms.  Reports worsening of symptoms over the past 4 to 5 days.  Family member bedside states the patient has slept very minimally over the past 4 to 5 days.  Was seen at behavioral with urgent care yesterday morning the family member unaware of the events surrounding visit and note not completed so unsure of what disposition/plan for patient was.  Patient reports both auditory and visual hallucinations.  Denies homicidal or suicidal ideation.  Denies chest pain, shortness of breath, abdominal pain, nausea, vomiting, urinary symptoms, change in bowel habits.  Denies alcohol or illicit drug use.  Past medical history significant for iron deficiency anemia, panic disorder, anorexia nervosa, tic disorder, bipolar 1 disorder  Home Medications Prior to Admission medications   Medication Sig Start Date End Date Taking? Authorizing Provider  Fiber Select Gummies CHEW Chew 2 tablets by mouth daily as needed (constipation). Patient not taking: Reported on 05/18/2022    [provider]  hydrOXYzine (ATARAX) 25 MG tablet Take 1 tablet at bedtime as needed for insomnia. 10/30/22   Wallis Bamberg, PA-C  OLANZapine (ZYPREXA) 5 MG tablet Take 1 tablet (5 mg total) by mouth in the morning and at bedtime. 11/10/19 12/10/19  Sherlyn Lick, MD      Allergies    Amoxicillin    Review of Systems   Review of Systems  All other systems reviewed and are negative.   Physical  Exam Updated Vital Signs BP (!) 152/97 (BP Location: Right Arm)   Pulse (!) 107   Temp 99 F (37.2 C) (Oral)   Resp 18   Ht  (1.702 m)   Wt 78.9 kg   LMP 10/29/2022 (Exact Date)   SpO2 100%   BMI 27.24 kg/m  Physical Exam Vitals and nursing note reviewed.  Constitutional:      General: She is not in acute distress.    Appearance: She is well-developed.     Comments: Patient with rhythmic movements noted in bilateral wrists as well as twitching of neck.  Patient with tangential speech that seems pressured.  HENT:     Head: Normocephalic and atraumatic.  Eyes:     Conjunctiva/sclera: Conjunctivae normal.  Cardiovascular:     Rate and Rhythm: Normal rate and regular rhythm.     Heart sounds: No murmur heard. Pulmonary:     Effort: Pulmonary effort is normal. No respiratory distress.     Breath sounds: Normal breath sounds. No wheezing, rhonchi or rales.  Abdominal:     Palpations: Abdomen is soft.     Tenderness: There is no abdominal tenderness.  Musculoskeletal:        General: No swelling.     Cervical back: Neck supple.     Right lower leg: No edema.     Left lower leg: No edema.  Skin:    General: Skin is warm and dry.     Capillary Refill: Capillary refill takes less than 2 seconds.  Neurological:  Mental Status: She is alert.  Psychiatric:        Mood and Affect: Mood normal.     ED Results / Procedures / Treatments   Labs (all labs ordered are listed, but only abnormal results are displayed) Labs Reviewed  COMPREHENSIVE METABOLIC PANEL - Abnormal; Notable for the following components:      Result Value   Total Protein 8.4 (*)    All other components within normal limits  CBC WITH DIFFERENTIAL/PLATELET - Abnormal; Notable for the following components:   WBC 13.9 (*)    Platelets 427 (*)    Neutro Abs 9.5 (*)    All other components within normal limits  ETHANOL  RAPID URINE DRUG SCREEN, HOSP PERFORMED  HCG, QUANTITATIVE, PREGNANCY     EKG None  Radiology DG Chest 2 View  Result Date: 10/30/2022 CLINICAL DATA:  Chest pressure. EXAM: CHEST - 2 VIEW COMPARISON:  None Available. FINDINGS: The heart size and mediastinal contours are within normal limits. Both lungs are clear. No visible pleural effusions or pneumothorax. No acute osseous abnormality. IMPRESSION: No active cardiopulmonary disease. Electronically Signed   By: Feliberto Harts M.D.   On: 10/30/2022 16:42    Procedures Procedures    Medications Ordered in ED Medications  hydrOXYzine (ATARAX) tablet 25 mg (has no administration in time range)  acetaminophen (TYLENOL) tablet 500 mg (has no administration in time range)    ED Course/ Medical Decision Making/ A&P                             Medical Decision Making Amount and/or Complexity of Data Reviewed Labs: ordered.   This patient presents to the ED for concern of psychosis, this involves an extensive number of treatment options, and is a complaint that carries with it a high risk of complications and morbidity.  The differential diagnosis includes undergoing a medication/withdrawal, acute psychosis, electrolyte derangement,   Co morbidities that complicate the patient evaluation  See HPI   Additional history obtained:  Additional history obtained from EMR External records from outside source obtained and reviewed including hospital records   Lab Tests:  I Ordered, and personally interpreted labs.  The pertinent results include: Leukocytosis of 13.90.  No evidence anemia.  Moderate thrombocytosis of 427.  No electrolyte abnormalities.  No renal dysfunction.  No transaminitis.  hCG negative.  Ethanol negative.  UDS negative.   Imaging Studies ordered:  N/a   Cardiac Monitoring: / EKG:  The patient was maintained on a cardiac monitor.  I personally viewed and interpreted the cardiac monitored which showed an underlying rhythm of: Sinus tachycardia   Consultations Obtained:  I  requested consultation with CTS which is pending  Problem List / ED Course / Critical interventions / Medication management  Acute psychosis I ordered medication including Tylenol, hydroxyzine   Reevaluation of the patient after these medicines showed that the patient improved I have reviewed the patients home medicines and have made adjustments as needed   Social Determinants of Health:  Denies tobacco, illicit drug use   Test / Admission - Considered:  Acute psychosis Laboratory studies significant for: See above 22 year old female presents emergency department with concerns of acute psychosis.  Patient with both auditory and visual hallucinations with insomnia for the past 4 to 5 days.  Patient with history of bipolar 1 disorder but not currently taking any medication for said symptoms.  Patient cleared medically for TTS consultation.  Final Clinical Impression(s) / ED Diagnoses Final diagnoses:  Acute psychosis    Rx / DC Orders ED Discharge Orders     None         Peter Garter, [place] 11/01/22 1545    Benjiman Core, MD 11/02/22 [id number]

## 2022-11-01 NOTE — ED Notes (Signed)
Pt was given a [place] sandwich and sprite.

## 2022-11-01 NOTE — ED Notes (Signed)
Lab called to add on hcg quant 

## 2022-11-01 NOTE — ED Notes (Signed)
Pt sitting on stretcher appearing anxious. Fidgeting with her hands, pulling at her hair. Brother at bedside.

## 2022-11-01 NOTE — ED Triage Notes (Signed)
Pt saying alright when questions asked in triage, hitting the wall in triage, laughing.  Pt went to [facility] yesterday.  Stopped meds on her own due to unable to tolerate them.  Hx of cutting.  Hx of bipolar, anxiety, and depression.

## 2022-11-01 NOTE — ED Notes (Signed)
Pt exited her room a few times inquiring about the time of day, pt was give the time. Pt began pacing around in her room. Nurse approached pt and offered assistance pt declined and continue to pace around. Nurse offered her medication, pt agreed and stated it  may help her go to sleep. Zyprexa and ativan given.

## 2022-11-02 MED ORDER — LORAZEPAM 1 MG PO TABS
2.0000 mg | ORAL_TABLET | Freq: Once | ORAL | Status: AC
  Administered 2022-11-02: 2 mg via ORAL
  Filled 2022-11-02: qty 2

## 2022-11-02 NOTE — ED Notes (Signed)
Patient calm and cooperative this afternoon. 

## 2022-11-02 NOTE — Progress Notes (Signed)
[facility] Psych ED Progress Note  11/02/2022 6:22 PM Kara Nguyen  MRN:  [id number]   Subjective:   Per Triage Note: Pt saying alright when questions asked in triage, hitting the wall in triage, laughing.  Pt went to [facility] two days ago,  Stopped meds on her own due to unable to tolerate them.  Hx of cutting. Hx of bipolar, anxiety, and depression.  Patient was seen in the room awake but made minimal eye contact with provider.  Patient however answered questions asked of her.  She reports poor sleep and racing thought.  She added that for four days she did not sleep due to racing thought.  She reports some improvement since taking low dose Olanzapine.  Patient has not had any concerning behavior needing PRN medications.  She continues to require inpatient Psychiatry hospitalization for safety and stabilization.   She denies SI/HI/AVH and no mention of paranoia. Principal Problem: Bipolar disorder Diagnosis:  Principal Problem:   Bipolar disorder Active Problems:   Mania   ED Assessment Time Calculation: Start Time: 1809 Stop Time: 1822 Total Time in Minutes (Assessment Completion): 13   Past Psychiatric History: see initial Psychiatry note  [place] Scale:  Flowsheet Row ED from 11/01/2022 in [facility] Emergency Department at [facility] ED from 10/31/2022 in [facility] ED from 10/30/2022 in [facility] at [facility] RISK CATEGORY No Risk No Risk No Risk       Past Medical History:  Past Medical History:  Diagnosis Date   Anxiety    Bipolar disorder    Deliberate self-cutting    Depression    History reviewed. No pertinent surgical history. Family History:  Family History  Problem Relation Age of Onset   Cancer Other    Diabetes Paternal Grandmother    Family Psychiatric  History: see initial Psychiatry note Social History:  Social History   Substance and Sexual Activity  Alcohol Use Never   Alcohol/week: 0.0  standard drinks of alcohol     Social History   Substance and Sexual Activity  Drug Use Not Currently   Types: Solvent inhalants    Social History   Socioeconomic History   Marital status: Single    Spouse name: Not on file   Number of children: 0   Years of education: Not on file   Highest education level: 10th grade  Occupational History   Occupation: Consulting civil engineer  Tobacco Use   Smoking status: Never   Smokeless tobacco: Never  Vaping Use   Vaping Use: Never used  Substance and Sexual Activity   Alcohol use: Never    Alcohol/week: 0.0 standard drinks of alcohol   Drug use: Not Currently    Types: Solvent inhalants   Sexual activity: Never  Other Topics Concern   Not on file  Social History Narrative   Pt is an 11th grader at [employer].  She lives with mother, father, and siblings.  Pt is followed by [facility] outpatient psychiatry for treatment of Bipolar I.   Social Determinants of Health   Financial Resource Strain: Not on file  Food Insecurity: Not on file  Transportation Needs: Not on file  Physical Activity: Not on file  Stress: Not on file  Social Connections: Not on file    Sleep: Fair  Appetite:  Fair  Current Medications: Current Facility-Administered Medications  Medication Dose Route Frequency Provider Last Rate Last Admin   acetaminophen (TYLENOL) tablet 500 mg  500 mg Oral  Q6H PRN Sherian Maroon A, PA       hydrOXYzine (ATARAX) tablet 25 mg  25 mg Oral TID PRN Sherian Maroon A, PA   25 mg at 11/02/22 0405   OLANZapine (ZYPREXA) tablet 5 mg  5 mg Oral BID Motley-Mangrum, Jadeka A, PMHNP   5 mg at 11/02/22 0454   Current Outpatient Medications  Medication Sig Dispense Refill   hydrOXYzine (ATARAX) 25 MG tablet Take 1 tablet at bedtime as needed for insomnia. (Patient not taking: Reported on 11/01/2022) 30 tablet 0   OLANZapine (ZYPREXA) 5 MG tablet Take 1 tablet (5 mg total) by mouth in the morning and at bedtime. (Patient not taking:  Reported on 11/01/2022) 60 tablet 0    Lab Results:  Results for orders placed or performed during the hospital encounter of 11/01/22 (from the past 48 hour(s))  Comprehensive metabolic panel     Status: Abnormal   Collection Time: 11/01/22 11:18 AM  Result Value Ref Range   Sodium 139 135 - 145 mmol/L   Potassium 4.0 3.5 - 5.1 mmol/L   Chloride 106 98 - 111 mmol/L   CO2 23 22 - 32 mmol/L   Glucose, Bld 95 70 - 99 mg/dL    Comment: Glucose reference range applies only to samples taken after fasting for at least 8 hours.   BUN 9 6 - 20 mg/dL   Creatinine, Ser 0.98 0.44 - 1.00 mg/dL   Calcium 9.3 8.9 - 11.9 mg/dL   Total Protein 8.4 (H) 6.5 - 8.1 g/dL   Albumin 4.3 3.5 - 5.0 g/dL   AST 23 15 - 41 U/L   ALT 16 0 - 44 U/L   Alkaline Phosphatase 80 38 - 126 U/L   Total Bilirubin 0.5 0.3 - 1.2 mg/dL   GFR, Estimated >14 >78 mL/min    Comment: (NOTE) Calculated using the CKD-EPI Creatinine Equation (2021)    Anion gap 10 5 - 15    Comment: Performed at [facility], [address], [place], [place] [address]  Ethanol     Status: None   Collection Time: 11/01/22 11:18 AM  Result Value Ref Range   Alcohol, Ethyl (B) <10 <10 mg/dL    Comment: (NOTE) Lowest detectable limit for serum alcohol is 10 mg/dL.  For medical purposes only. Performed at [facility], [address], [place], [place] [address]   CBC with Diff     Status: Abnormal   Collection Time: 11/01/22 11:18 AM  Result Value Ref Range   WBC 13.9 (H) 4.0 - 10.5 K/uL   RBC 4.40 3.87 - 5.11 MIL/uL   Hemoglobin 12.8 12.0 - 15.0 g/dL   HCT 57.8 46.9 - 62.9 %   MCV 89.3 80.0 - 100.0 fL   MCH 29.1 26.0 - 34.0 pg   MCHC 32.6 30.0 - 36.0 g/dL   RDW 52.8 41.3 - 24.4 %   Platelets 427 (H) 150 - 400 K/uL   nRBC 0.0 0.0 - 0.2 %   Neutrophils Relative % 70 %   Neutro Abs 9.5 (H) 1.7 - 7.7 K/uL   Lymphocytes Relative 22 %   Lymphs Abs 3.1 0.7 - 4.0 K/uL   Monocytes Relative 7 %    Monocytes Absolute 1.0 0.1 - 1.0 K/uL   Eosinophils Relative 1 %   Eosinophils Absolute 0.2 0.0 - 0.5 K/uL   Basophils Relative 0 %   Basophils Absolute 0.1 0.0 - 0.1 K/uL   Immature Granulocytes 0 %  Abs Immature Granulocytes 0.04 0.00 - 0.07 K/uL    Comment: Performed at [facility], [address], [place], [place] [address]  hCG, quantitative, pregnancy     Status: None   Collection Time: 11/01/22 11:21 AM  Result Value Ref Range   hCG, Beta Chain, Quant, S <1 <5 mIU/mL    Comment:          GEST. AGE      CONC.  (mIU/mL)   <=1 WEEK        5 - 50     2 WEEKS       50 - 500     3 WEEKS       100 - 10,000     4 WEEKS     1,000 - 30,000     5 WEEKS     3,500 - 115,000   6-8 WEEKS     12,000 - 270,000    12 WEEKS     15,000 - 220,000        FEMALE AND NON-PREGNANT FEMALE:     LESS THAN 5 mIU/mL Performed at [facility], [address], [place], [place] [address]   Urine rapid drug screen (hosp performed)     Status: None   Collection Time: 11/01/22  2:39 PM  Result Value Ref Range   Opiates NONE DETECTED NONE DETECTED   Cocaine NONE DETECTED NONE DETECTED   Benzodiazepines NONE DETECTED NONE DETECTED   Amphetamines NONE DETECTED NONE DETECTED   Tetrahydrocannabinol NONE DETECTED NONE DETECTED   Barbiturates NONE DETECTED NONE DETECTED    Comment: (NOTE) DRUG SCREEN FOR MEDICAL PURPOSES ONLY.  IF CONFIRMATION IS NEEDED FOR ANY PURPOSE, NOTIFY LAB WITHIN 5 DAYS.  LOWEST DETECTABLE LIMITS FOR URINE DRUG SCREEN Drug Class                     Cutoff (ng/mL) Amphetamine and metabolites    1000 Barbiturate and metabolites    200 Benzodiazepine                 200 Opiates and metabolites        300 Cocaine and metabolites        300 THC                            50 Performed at [facility], [address], [place], [place] [address]     Blood Alcohol level:  Lab Results  Component Value Date   [facility] <10  11/01/2022   ETH <10 11/05/2019    Physical Findings:  CIWA:    COWS:     Musculoskeletal: Strength & Muscle Tone: within normal limits Gait & Station: normal Patient leans: Front  Psychiatric Specialty Exam:  Presentation  General Appearance:  Casual  Eye Contact: Minimal  Speech: Clear and Coherent; Normal Rate  Speech Volume: Normal  Handedness: Right   Mood and Affect  Mood: Anxious  Affect: Congruent   Thought Process  Thought Processes: Coherent; Goal Directed; Linear  Descriptions of Associations:Intact  Orientation:Full (Time, Place and Person)  Thought Content:Logical  History of Schizophrenia/Schizoaffective disorder:No data recorded Duration of Psychotic Symptoms:No data recorded Hallucinations:Hallucinations: None  Ideas of Reference:None  Suicidal Thoughts:Suicidal Thoughts: No  Homicidal Thoughts:Homicidal Thoughts: No   Sensorium  Memory: Immediate Good; Recent Good; Remote Good  Judgment: Good  Insight: Good   Executive Functions  Concentration: Good  Attention  Span: Good  Recall: Good  Fund of Knowledge: Good  Language: Good   Psychomotor Activity  Psychomotor Activity: Psychomotor Activity: Normal   Assets  Assets: Communication Skills; Desire for Improvement; Housing; Physical Health; Resilience   Sleep  Sleep: Sleep: Fair    Physical Exam: Physical Exam Vitals and nursing note reviewed.  Constitutional:      Appearance: She is obese.  HENT:     Head: Normocephalic and atraumatic.     Nose: Nose normal.  Cardiovascular:     Rate and Rhythm: Normal rate and regular rhythm.  Pulmonary:     Effort: Pulmonary effort is normal.  Musculoskeletal:        General: Normal range of motion.     Cervical back: Normal range of motion.  Skin:    General: Skin is warm and dry.  Neurological:     General: No focal deficit present.     Mental Status: She is alert and oriented to person,  place, and time.  Psychiatric:        Attention and Perception: Attention and perception normal.        Mood and Affect: Mood is anxious. Affect is blunt.        Speech: Speech normal.        Behavior: Behavior is cooperative.        Thought Content: Thought content normal.        Cognition and Memory: Cognition and memory normal.        Judgment: Judgment normal.    Review of Systems  Constitutional: Negative.   HENT: Negative.    Eyes: Negative.   Respiratory: Negative.    Cardiovascular: Negative.   Gastrointestinal: Negative.   Genitourinary: Negative.   Musculoskeletal: Negative.   Skin: Negative.   Endo/Heme/Allergies: Negative.   Psychiatric/Behavioral:  The patient is nervous/anxious.    Blood pressure (!) 136/98, pulse 96, temperature 98.8 F (37.1 C), temperature source Oral, resp. rate 18, height  (1.702 m), weight 78.9 kg, last menstrual period 10/29/2022, SpO2 100 %. Body mass index is 27.24 kg/m.   Medical Decision Making: We will continue to seek inpatient Psychiatry hospitalization for medication management and stabilization.  We will fax out records if we do not secure a bed at [facility]. Patient to continue taking Olanzapine 5 mg twice a day. Disposition;  Inpatient Psychiatry admission.  Earney Navy, NP-PMHNP-BC 11/02/2022, 6:22 PM

## 2022-11-02 NOTE — ED Provider Notes (Signed)
Emergency Medicine Observation Re-evaluation Note  Kara Nguyen is a 22 y.o. female, seen on rounds today.  Pt initially presented to the ED for complaints of Psychiatric Evaluation Currently, the patient is resting.  Physical Exam  BP 132/81 (BP Location: Left Arm)   Pulse 87   Temp 99 F (37.2 C) (Oral)   Resp 19   Ht  (1.702 m)   Wt 78.9 kg   LMP 10/29/2022 (Exact Date)   SpO2 100%   BMI 27.24 kg/m  Physical Exam General: NAD   ED Course / MDM  EKG:   I have reviewed the labs performed to date as well as medications administered while in observation.  Recent changes in the last 24 hours include no acute events reported.  Plan  Current plan is for inpatient placement.    Wynetta Fines, MD 11/02/22 [phone]

## 2022-11-03 ENCOUNTER — Other Ambulatory Visit: Payer: Self-pay

## 2022-11-03 ENCOUNTER — Encounter (HOSPITAL_COMMUNITY): Payer: Self-pay | Admitting: Nurse Practitioner

## 2022-11-03 ENCOUNTER — Inpatient Hospital Stay (HOSPITAL_COMMUNITY)
Admission: AD | Admit: 2022-11-03 | Discharge: 2022-11-09 | DRG: 885 | Disposition: A | Discharge status: Home / Self Care | Payer: Medicaid Other | Source: Intra-hospital | Attending: Psychiatry | Admitting: Psychiatry

## 2022-11-03 DIAGNOSIS — G4700 Insomnia, unspecified: Secondary | ICD-10-CM | POA: Diagnosis present

## 2022-11-03 DIAGNOSIS — F29 Unspecified psychosis not due to a substance or known physiological condition: Principal | ICD-10-CM | POA: Diagnosis present

## 2022-11-03 DIAGNOSIS — F411 Generalized anxiety disorder: Secondary | ICD-10-CM | POA: Diagnosis present

## 2022-11-03 DIAGNOSIS — Z9152 Personal history of nonsuicidal self-harm: Secondary | ICD-10-CM | POA: Diagnosis not present

## 2022-11-03 DIAGNOSIS — F3110 Bipolar disorder, current episode manic without psychotic features, unspecified: Secondary | ICD-10-CM | POA: Diagnosis present

## 2022-11-03 DIAGNOSIS — R45851 Suicidal ideations: Secondary | ICD-10-CM | POA: Diagnosis present

## 2022-11-03 DIAGNOSIS — Z79899 Other long term (current) drug therapy: Secondary | ICD-10-CM | POA: Diagnosis not present

## 2022-11-03 DIAGNOSIS — Z91199 Patient's noncompliance with other medical treatment and regimen due to unspecified reason: Secondary | ICD-10-CM

## 2022-11-03 DIAGNOSIS — F429 Obsessive-compulsive disorder, unspecified: Secondary | ICD-10-CM | POA: Diagnosis present

## 2022-11-03 MED ORDER — LORAZEPAM 2 MG/ML IJ SOLN: 2.0000 mg | Freq: Three times a day (TID) | INTRAMUSCULAR | Status: DC | PRN

## 2022-11-03 MED ORDER — TRAZODONE HCL 50 MG PO TABS
50.0000 mg | ORAL_TABLET | Freq: Every evening | ORAL | Status: DC | PRN
  Administered 2022-11-04 – 2022-11-06 (×3): 50 mg via ORAL
  Filled 2022-11-03 (×3): qty 1

## 2022-11-03 MED ORDER — ALUM & MAG HYDROXIDE-SIMETH 200-200-20 MG/5ML PO SUSP: 30.0000 mL | ORAL | Status: DC | PRN

## 2022-11-03 MED ORDER — LORAZEPAM 1 MG PO TABS: 2.0000 mg | ORAL_TABLET | Freq: Three times a day (TID) | ORAL | Status: DC | PRN

## 2022-11-03 MED ORDER — HYDROXYZINE HCL 25 MG PO TABS
25.0000 mg | ORAL_TABLET | Freq: Three times a day (TID) | ORAL | Status: DC | PRN
  Administered 2022-11-04 – 2022-11-09 (×7): 25 mg via ORAL
  Filled 2022-11-03 (×7): qty 1

## 2022-11-03 MED ORDER — HALOPERIDOL 5 MG PO TABS: 5.0000 mg | ORAL_TABLET | Freq: Three times a day (TID) | ORAL | Status: DC | PRN

## 2022-11-03 MED ORDER — ACETAMINOPHEN 325 MG PO TABS: 650.0000 mg | ORAL_TABLET | Freq: Four times a day (QID) | ORAL | Status: DC | PRN

## 2022-11-03 MED ORDER — HALOPERIDOL LACTATE 5 MG/ML IJ SOLN: 5.0000 mg | Freq: Three times a day (TID) | INTRAMUSCULAR | Status: DC | PRN

## 2022-11-03 MED ORDER — MAGNESIUM HYDROXIDE 400 MG/5ML PO SUSP
30.0000 mL | Freq: Every day | ORAL | Status: DC | PRN
  Administered 2022-11-09: 30 mL via ORAL
  Filled 2022-11-03: qty 30

## 2022-11-03 NOTE — ED Notes (Signed)
Safe transport was called to get a more accurate ETA. Per dispatcher, safe transport specialist should be en route.

## 2022-11-03 NOTE — Tx Team (Signed)
Initial Treatment Plan 11/03/2022 1:00 PM Kara Nguyen [id number]    PATIENT STRESSORS: Health problems   Medication change or noncompliance     PATIENT STRENGTHS: Religious Affiliation  Supportive family/friends    PATIENT IDENTIFIED PROBLEMS: "Get back on track"  "Get back to my art"  Anxiety  Medication noncompliance  Ineffective coping skills  Hallucinations           DISCHARGE CRITERIA:  Ability to meet basic life and health needs Adequate post-discharge living arrangements Safe-care adequate arrangements made  PRELIMINARY DISCHARGE PLAN: Attend aftercare/continuing care group Outpatient therapy Return to previous living arrangement  PATIENT/FAMILY INVOLVEMENT: This treatment plan has been presented to and reviewed with the patient, Kara Nguyen, and/or family member.  The patient and family have been given the opportunity to ask questions and make suggestions.  Audrie Lia Harveer Sadler, RN 11/03/2022, 1:00 PM

## 2022-11-03 NOTE — ED Notes (Signed)
Bag of patient belongings sent with patient.

## 2022-11-03 NOTE — Progress Notes (Addendum)
Addendum:  Per [facility] ED nursing Gretta Arab, RN, informed that signed vol consent has been faxed to [facility] via fax [phone].   CSW inquired with [facility] Edythe Clarity, RN to review pt for inpatient behavioral health placement. Pt meets inpatient criteria per Earney Navy, NP. with [facility] Edythe Clarity, RN has requested IVC paperwork be sent to [facility] via fax [phone]. [facility] ED nursing Gretta Arab, RN advised that pt is voluntary.  Care Team notified: [facility] Edythe Clarity, RN, Earney Navy, NP, Gretta Arab, RN, [place], LCSWA    Maryjean Ka, MSW, [facility] 11/03/2022 12:18 AM

## 2022-11-03 NOTE — ED Notes (Signed)
Pt's breakfast has arrived, pt sitting up and eating breakfast now

## 2022-11-03 NOTE — ED Notes (Signed)
Pt's door closed due to another pt being disruptive. Lights turned on and pt made aware that lights must be on. Pt lying on bed now

## 2022-11-03 NOTE — ED Notes (Signed)
Patient in room pacing.  No signs of verbal or physical escalation.

## 2022-11-03 NOTE — Progress Notes (Signed)
Admission Note: Patient is a 22 year old female admitted to the unit voluntarily for [facility] for anxiety, bizarre behaviors, and medication noncompliance.  Patient is alert and oriented to person, place and time.  Patient presents with anxious mood and affect.  Appears fidgety, restless with bizarre hand gestures.  Observed to be responding to internal stimuli and mumbling to self.  Admission plan of care reviewed, consent signed.  Skin assessment and personal belongings completed.  Skin is dry and intact.  No contraband found.  Patient oriented to the unit, staff and room.  Routine safety checks initiated.  Verbalizes understanding of unit rules/protocols.  Patient is safe on the unit.

## 2022-11-03 NOTE — ED Notes (Signed)
Pt verbalizing that she is feeling slightly anxious and would like to listen to music. Writer attempted to find a music channel for the pt. Crayons and paper provided to pt.  Pt's family member visiting pt now

## 2022-11-03 NOTE — ED Notes (Signed)
Safe transport called for transport. 

## 2022-11-03 NOTE — ED Provider Notes (Signed)
Emergency Medicine Observation Re-evaluation Note  Kara Nguyen is a 22 y.o. female, seen on rounds today.  Pt initially presented to the ED for complaints of Psychiatric Evaluation Currently, the patient is resting.  Physical Exam  BP 120/83 (BP Location: Left Arm)   Pulse 99   Temp 98.5 F (36.9 C) (Oral)   Resp 16   Ht  (1.702 m)   Wt 78.9 kg   LMP 10/29/2022 (Exact Date)   SpO2 100%   BMI 27.24 kg/m  Physical Exam General: NAD   ED Course / MDM  EKG:EKG Interpretation  Date/Time:  Sunday November 01 2022 11:37:47 EDT Ventricular Rate:  117 PR Interval:  118 QRS Duration: 82 QT Interval:  399 QTC Calculation: 557 R Axis:   64 Text Interpretation: Sinus tachycardia Atrial premature complex Borderline Q waves in lateral leads Borderline T wave abnormalities Prolonged QT interval Baseline wander in lead(s) II Artifact Abnormal ECG Confirmed by Gerhard Munch [phone]) on 11/02/2022 10:40:55 AM  I have reviewed the labs performed to date as well as medications administered while in observation.  Recent changes in the last 24 hours include no acute events reported.  Plan  Current plan is for admission to [facility] today, [facility] accepting.    Wynetta Fines, MD 11/03/22 [phone]

## 2022-11-03 NOTE — Progress Notes (Signed)
Pt was accepted to [facility] TODAY  11/03/2022; Bed Assignment 507-2  DX: Bipolar disorder/mania  Pt meets inpatient criteria per Earney Navy, NP   Attending Physician will be Dr. Phineas Inches, MD  Report can be called to: -Adult unit: [phone]  Pt can arrive after: [facility] will coordinate with care team.  Care Team notified: [facility] Edythe Clarity, RN, Earney Navy, NP, Gretta Arab, RN, [place], LCSWA    [place], [place] 11/03/2022 @ 1:01 AM

## 2022-11-04 ENCOUNTER — Encounter (HOSPITAL_COMMUNITY): Payer: Self-pay

## 2022-11-04 DIAGNOSIS — F29 Unspecified psychosis not due to a substance or known physiological condition: Secondary | ICD-10-CM | POA: Diagnosis not present

## 2022-11-04 DIAGNOSIS — G4700 Insomnia, unspecified: Secondary | ICD-10-CM | POA: Diagnosis present

## 2022-11-04 DIAGNOSIS — F411 Generalized anxiety disorder: Secondary | ICD-10-CM | POA: Diagnosis present

## 2022-11-04 LAB — URINALYSIS, ROUTINE W REFLEX MICROSCOPIC
Bilirubin Urine: NEGATIVE
Glucose, UA: NEGATIVE mg/dL
Hgb urine dipstick: NEGATIVE
Ketones, ur: NEGATIVE mg/dL
Leukocytes,Ua: NEGATIVE
Nitrite: NEGATIVE
Protein, ur: NEGATIVE mg/dL
Specific Gravity, Urine: 1.024 (ref 1.005–1.030)
pH: 7 (ref 5.0–8.0)

## 2022-11-04 LAB — TSH: TSH: 0.662 u[IU]/mL (ref 0.350–4.500)

## 2022-11-04 MED ORDER — LORAZEPAM 1 MG PO TABS: 2.0000 mg | ORAL_TABLET | Freq: Three times a day (TID) | ORAL | Status: DC | PRN

## 2022-11-04 MED ORDER — HALOPERIDOL LACTATE 5 MG/ML IJ SOLN: 5.0000 mg | Freq: Three times a day (TID) | INTRAMUSCULAR | Status: DC | PRN

## 2022-11-04 MED ORDER — HALOPERIDOL 5 MG PO TABS: 5.0000 mg | ORAL_TABLET | Freq: Three times a day (TID) | ORAL | Status: DC | PRN

## 2022-11-04 MED ORDER — RISPERIDONE 1 MG PO TBDP
1.0000 mg | ORAL_TABLET | Freq: Two times a day (BID) | ORAL | Status: DC
  Administered 2022-11-04 – 2022-11-05 (×2): 1 mg via ORAL
  Filled 2022-11-04 (×7): qty 1

## 2022-11-04 MED ORDER — DIPHENHYDRAMINE HCL 25 MG PO CAPS
50.0000 mg | ORAL_CAPSULE | Freq: Three times a day (TID) | ORAL | Status: DC | PRN
  Administered 2022-11-05 (×2): 50 mg via ORAL
  Filled 2022-11-04 (×2): qty 2

## 2022-11-04 MED ORDER — LORAZEPAM 1 MG PO TABS
2.0000 mg | ORAL_TABLET | Freq: Three times a day (TID) | ORAL | Status: DC | PRN
  Administered 2022-11-05 (×2): 2 mg via ORAL
  Filled 2022-11-04 (×2): qty 2

## 2022-11-04 MED ORDER — DIPHENHYDRAMINE HCL 50 MG/ML IJ SOLN: 50.0000 mg | Freq: Three times a day (TID) | INTRAMUSCULAR | Status: DC | PRN

## 2022-11-04 MED ORDER — LORAZEPAM 2 MG/ML IJ SOLN: 2.0000 mg | Freq: Three times a day (TID) | INTRAMUSCULAR | Status: DC | PRN

## 2022-11-04 MED ORDER — LORAZEPAM 2 MG/ML IJ SOLN: 2.0000 mg | Freq: Four times a day (QID) | INTRAMUSCULAR | Status: DC | PRN

## 2022-11-04 MED ORDER — HALOPERIDOL 5 MG PO TABS
5.0000 mg | ORAL_TABLET | Freq: Three times a day (TID) | ORAL | Status: DC | PRN
  Administered 2022-11-05 (×2): 5 mg via ORAL
  Filled 2022-11-04 (×2): qty 1

## 2022-11-04 MED ORDER — LORAZEPAM 1 MG PO TABS: 2.0000 mg | ORAL_TABLET | Freq: Four times a day (QID) | ORAL | Status: DC | PRN

## 2022-11-04 MED ORDER — DIPHENHYDRAMINE HCL 25 MG PO CAPS: 50.0000 mg | ORAL_CAPSULE | Freq: Three times a day (TID) | ORAL | Status: DC | PRN

## 2022-11-04 NOTE — Group Note (Signed)
Date:  11/04/2022 Time:  8:56 PM     Participation Level:  Active  Participation Quality:  Redirectable  Affect:  Not Congruent  Cognitive:  Lacking  Insight: Lacking  Engagement in Group:  Distracting  Modes of Intervention:  Discussion and Education  Additional Comments:  Pt attended and participated in wrap up group this evening and rated their day a 5/10. Pt stated that they had a good day, but that they stayed in their room most of the day and did not attend groups. Pt had to be reminded by a peer that they are in the AM group rearranging furniture in the dayroom. Pt states that they feel anxious when they feel unsafe and that they feel unsafe in the hospital. Pt assures writer that their is nothing that staff can  do to make them feel more safe and that it is "a process". While in group pt continued to have inappropriate laughter, but was redirectable by Clinical research associate.   Chrisandra Netters 11/04/2022, 8:56 PM

## 2022-11-04 NOTE — Progress Notes (Signed)
   11/03/22 2115  Psych Admission Type (Psych Patients Only)  Admission Status Voluntary  Psychosocial Assessment  Patient Complaints Anxiety  Eye Contact Poor  Facial Expression Animated  Affect Anxious  Speech Soft;Tangential  Interaction Childlike  Motor Activity Fidgety  Appearance/Hygiene In scrubs  Behavior Characteristics Unwilling to participate;Anxious;Fidgety;Guarded  Mood Anxious  Thought Process  Coherency Tangential  Content Preoccupation  Delusions None reported or observed  Perception WDL  Hallucination None reported or observed  Judgment Impaired  Confusion None  Danger to Self  Current suicidal ideation? Denies  Danger to Others  Danger to Others None reported or observed   Upon assessment, pt observed sitting in a corner in her room with the lights off. Pt is not very interactive during conversation, but denies SI/HI/AVH. Pt fidgeting with hands and rocking back and forth. Pt denies any questions or concerns.

## 2022-11-04 NOTE — [facility] Counselor (Signed)
Adult Comprehensive Assessment  Patient ID: Kara Nguyen, female   DOB: [birth year], 22 y.o.   MRN: [id number]  Information Source: Information source: Patient  Current Stressors:  Patient states their primary concerns and needs for treatment are:: "I have had a lot of stress and anxiety" Patient states their goals for this hospitilization and ongoing recovery are:: Patient unable to identify any goals for hospitalization Educational / Learning stressors: patient does not have any educational stressors Employment / Job issues: Patient currently unemployed Family Relationships: Patient states that she has good family support Surveyor, quantity / Lack of resources (include bankruptcy): Patient states that her parents are supportive Housing / Lack of housing: Patient states that she lives with family (mom, dad, brother) for 6 years Physical health (include injuries & life threatening diseases): patient states that she believes her physical health is good but "has not checked" Social relationships: limited social support Substance abuse: Patient denies substance use, she states she only takes the pills the doctors are giving her. Bereavement / Loss: no stressors  Living/Environment/Situation:  Living Arrangements: Parent Living conditions (as described by patient or guardian): patient states that she lives in a good environment with her family Who else lives in the home?: mother, father and brother How long has patient lived in current situation?: 6 years What is atmosphere in current home: Comfortable, Supportive  Family History:  Marital status: Single Are you sexually active?: No Has your sexual activity been affected by drugs, alcohol, medication, or emotional stress?: n/a Does patient have children?: No  Childhood History:  By whom was/is the patient raised?: Both parents Additional childhood history information: patient states that her childhood was hard but she recieved the  necessities as a child Description of patient's relationship with caregiver when they were a child: Good Patient's description of current relationship with people who raised him/her: good How were you disciplined when you got in trouble as a child/adolescent?: normally Does patient have siblings?: Yes Number of Siblings: 1 Description of patient's current relationship with siblings: brother, good relationship Did patient suffer any verbal/emotional/physical/sexual abuse as a child?: No Did patient suffer from severe childhood neglect?: No Has patient ever been sexually abused/assaulted/raped as an adolescent or adult?: No Was the patient ever a victim of a crime or a disaster?: No Witnessed domestic violence?: No Has patient been affected by domestic violence as an adult?: No  Education:  Highest grade of school patient has completed: 12th Currently a student?: No Learning disability?: No  Employment/Work Situation:   Employment Situation: Unemployed Patient's Job has Been Impacted by Current Illness: Yes Describe how Patient's Job has Been Impacted: Mother reports patient doesn't want to go to school because she says people talk about her, and patient is also afraid of being around other people.  What is the Longest Time Patient has Held a Job?: patient has not had a job Where was the Patient Employed at that Time?: N/A Has Patient ever Been in the [employer]?: No  Financial Resources:   Surveyor, quantity resources: Support from parents / caregiver Does patient have a Lawyer or guardian?: No  Alcohol/Substance Abuse:   What has been your use of drugs/alcohol within the last 12 months?: none If attempted suicide, did drugs/alcohol play a role in this?: No Alcohol/Substance Abuse Treatment Hx: Denies past history Has alcohol/substance abuse ever caused legal problems?: No  Social Support System:   Conservation officer, nature Support System: Fair Development worker, community Support System:  family, Type of faith/religion: Catholic How does patient's faith help  to cope with current illness?: patient states that she prays and reads the bible  Leisure/Recreation:   Do You Have Hobbies?: Yes Leisure and Hobbies: Drawing  Strengths/Needs:   What is the patient's perception of their strengths?: patient states that she is good at drawing Patient states they can use these personal strengths during their treatment to contribute to their recovery: yes Patient states these barriers may affect/interfere with their treatment: patient unable to identify Patient states these barriers may affect their return to the community: patient is unable to identify Other important information patient would like considered in planning for their treatment: n/a  Discharge Plan:   Currently receiving community mental health services: No (Patient states that doctor, Ralene Ok prescribes medications) Patient states concerns and preferences for aftercare planning are: none Patient states they will know when they are safe and ready for discharge when: patient states that her stress level will get better Does patient have access to transportation?: Yes Does patient have financial barriers related to discharge medications?: No Patient description of barriers related to discharge medications: none Will patient be returning to same living situation after discharge?: Yes  Summary/Recommendations:   Summary and Recommendations (to be completed by the evaluator): Kara Nguyen is a 22 year old female who was admitted to [facility] for increased anxiety.  She presents with blunted affect.  Patient states that she has been having a lot of stress and anxiety. She lives with her mother, father and brother and states that she has a good relationship with them.  She reports that she is not connected to an outpatient provider and her general practitioner, Kara Nguyen prescribes medications.  Patient is unemployed.  She relies on  family for financial support. While here, Kara Nguyen can benefit from crisis stabilization, medication management, therapeutic milieu, and referrals for services.   Kara Nguyen. 11/04/2022

## 2022-11-04 NOTE — Progress Notes (Signed)
Pt given cup for urine specimen. 

## 2022-11-04 NOTE — Group Note (Signed)
Recreation Therapy Group Note   Group Topic:Relaxation  Group Date: 11/04/2022 Start Time: 1000 End Time: 1052 Facilitators: Kynzleigh Bandel-McCall, LRT,CTRS Location: [address] Dayroom   Goal Area(s) Addresses:  Patient will identify positive stress management techniques. Patient will identify benefits of using stress management post d/c.   Group Description:  Music Therapy.  LRT and patients discussed how music can be used to relax and calm you.  LRT let patients take turns picking songs they wanted to hear that helps them relax and be calm.  Patients were attentive and were able to express how the songs made them feel.   Affect/Mood: Flat   Participation Level: Non-verbal   Participation Quality: Independent   Behavior: Bizarre   Speech/Thought Process: N/A   Insight: None   Judgement: None   Modes of Intervention: Music   Patient Response to Interventions:  Challenging    Education Outcome:  In group clarification offered    Clinical Observations/Individualized Feedback: Pt was unable to focus or participate.  Pt sat by the window and was constantly doing movements with her hands with her eyes closed and was constantly dropping her glasses on the floor.  At one point, pt moved the chairs around in the corner she was sitting in before eventually moving them back.  Pt was quiet and in her own world/space.     Plan: Continue to engage patient in RT group sessions 2-3x/week.   Aralynn Brake-McCall, LRT,CTRS 11/04/2022 2:45 PM

## 2022-11-04 NOTE — Progress Notes (Signed)
   11/04/22 2015  Psych Admission Type (Psych Patients Only)  Admission Status Voluntary  Psychosocial Assessment  Patient Complaints Anxiety  Eye Contact Brief  Facial Expression Anxious;Animated  Affect Anxious  Speech Tangential;Language other than English  Interaction Cautious;Childlike  Motor Activity Fidgety  Appearance/Hygiene Unremarkable  Behavior Characteristics Cooperative  Mood Suspicious;Preoccupied  Aggressive Behavior  Effect No apparent injury  Thought Process  Coherency Tangential  Content Religiosity;Preoccupation  Delusions Paranoid  Perception Hallucinations  Hallucination Auditory  Judgment Impaired  Confusion Mild  Danger to Self  Current suicidal ideation? Denies

## 2022-11-04 NOTE — Progress Notes (Signed)
Pt presents with avertive to brief eye contact, mild confusion with thought blocking and anxious mood on interaction. Denies SI with writer but endorse SI on self inventory sheet. Contracted for safety when approach without attempts thus far. Denies HI, AVH and pain when assessed. However, noted to be preoccupied with blank stares, mild confusion and whispers to self at intervals. Verbal redirections offered repeatedly this shift for pulling STARR alarm and to cooperate with care. PRN Vistaril 25 mg given at 1716 for increasing anxiety / restless. Attended groups with encouragement. Safety checks maintained at Q 15 minutes intervals without outburst. Pt exhibit no insight into her mental illness or behavior leading to admission "That should not be any problem if I follow guys online that I like, they say I'm stalking people" on interactions with staff. Remains medication compliant without adverse drug reactions to note at this time.

## 2022-11-04 NOTE — [facility] Suicide Risk Assessment (Signed)
Suicide Risk Assessment  Admission Assessment    [facility] Admission Suicide Risk Assessment   Nursing information obtained from:  Patient Demographic factors:  Adolescent or young adult Current Mental Status:  NA Loss Factors:  NA Historical Factors:  NA Risk Reduction Factors:  NA  Total Time spent with patient: 1.5 hours Principal Problem: Schizophrenia spectrum disorder with psychotic disorder type not yet determined Diagnosis:  Principal Problem:   Schizophrenia spectrum disorder with psychotic disorder type not yet determined Active Problems:   Insomnia   Anxiety state  Subjective Data: CC: "I just get stressed and anxious when other people are around. It was weird. I was having racing thoughts, suicide thoughts, there is a paper that I signed. I was thinking of people who can help."   Continued Clinical Symptoms: Psychosis & depressed mood in need of continuous hospitalization for treatment and stabilization.   The "Alcohol Use Disorders Identification Test", Guidelines for Use in Primary Care, Second Edition.  World Science writer [facility]). Score between 0-7:  no or low risk or alcohol related problems. Score between 8-15:  moderate risk of alcohol related problems. Score between 16-19:  high risk of alcohol related problems. Score 20 or above:  warrants further diagnostic evaluation for alcohol dependence and treatment.  CLINICAL FACTORS:   Currently Psychotic  Musculoskeletal: Strength & Muscle Tone: within normal limits Gait & Station: normal Patient leans: N/A  Psychiatric Specialty Exam:  Presentation  General Appearance:  Disheveled  Eye Contact: Fair  Speech: Blocked  Speech Volume: Decreased  Handedness: Right   Mood and Affect  Mood: Depressed  Affect: Congruent   Thought Process  Thought Processes: Disorganized  Descriptions of Associations:Tangential  Orientation:Full (Time, Place and Person)  Thought  Content:Illogical  History of Schizophrenia/Schizoaffective disorder:No data recorded Duration of Psychotic Symptoms:No data recorded Hallucinations:Hallucinations: Visual  Ideas of Reference:Paranoia; Delusions  Suicidal Thoughts:Suicidal Thoughts: No  Homicidal Thoughts:Homicidal Thoughts: No   Sensorium  Memory: Immediate Good  Judgment: Poor  Insight: Poor   Executive Functions  Concentration: Poor  Attention Span: Fair  Recall: Good  Fund of Knowledge: Poor  Language: Fair   Psychomotor Activity  Psychomotor Activity: Psychomotor Activity: Normal   Assets  Assets: Social Support   Sleep  Sleep: Sleep: Poor    Physical Exam: Physical Exam Review of Systems  Psychiatric/Behavioral:  Positive for depression and hallucinations. Negative for memory loss, substance abuse and suicidal ideas. The patient is nervous/anxious and has insomnia.    Blood pressure (!) 140/86, pulse (!) 107, temperature 98.4 F (36.9 C), resp. rate 20, height  (1.753 m), weight 109.3 kg, last menstrual period 10/29/2022, SpO2 100 %. Body mass index is 35.59 kg/m.   COGNITIVE FEATURES THAT CONTRIBUTE TO RISK:  None    SUICIDE RISK:   Mild:  Suicidal ideation of limited frequency, intensity, duration, and specificity.  There are no identifiable plans, no associated intent, mild dysphoria and related symptoms, good self-control (both objective and subjective assessment), few other risk factors, and identifiable protective factors, including available and accessible social support.  PLAN OF CARE: See H & P  I certify that inpatient services furnished can reasonably be expected to improve the patient's condition.   Starleen Blue, NP 11/04/2022, 7:01 PM

## 2022-11-04 NOTE — H&P (Signed)
Psychiatric Admission Assessment Adult  Patient Identification: Kara Nguyen MRN:  [id number] Date of Evaluation:  11/04/2022 Chief Complaint:  Bipolar disorder, manic [F31.10] Principal Diagnosis: Schizophrenia spectrum disorder with psychotic disorder type not yet determined Diagnosis:  Principal Problem:   Schizophrenia spectrum disorder with psychotic disorder type not yet determined Active Problems:   Insomnia   Anxiety state  CC: "I just get stressed and anxious when other people are around. It was weird. I was having racing thoughts, suicide thoughts, there is a paper that I signed. I was thinking of people who can help."  Reason For Admission: Kara Nguyen is a 22 y.o. female  who presented to the [facility] on 04/24 with complaints of racing thoughts, insomnia, hyper religiosity, and "mania". Pt previously diagnoses with bipolar d/o, anxiety & depression.  Patient was transferred voluntarily on 02/23 to this [facility] for treatment and stabilization of her mental status.  As per chart review, pt initially presented to the [facility] ER with complaints of racing thoughts & flu-like symptoms on 4/19, and asked for to be referred to mental health. Pt was sent to the [name] behavioral health urgent care for a mental health evaluation, and reported difficulty sleeping x 2 days, a breakup with a BF that she met online, and feeling sad about not being able to live on her own. As per collateral information obtained while pt was at the [facility], she has not been on MH meds for 2 yrs, unsure what medications she had taken in the past, stated that she just needed help with sleep.  Mode of transport to Hospital: Safe transport  Current Outpatient (Home) Medication List: None  PRN medication prior to evaluation: Maalox, Tylenol, Milk of Mag, Trazodone 50 mg PRN nightly.  ED course:Uneventful.  Collateral Information: Writer called pt's father at [phone] [facility]) after obtaining her  consent in an attempt to get collateral. information. Names and phone number provided by patient. Pt's father answered phone, and Clinical research associate introduced self, and asked specific questions regarding mental health diagnoses, medications history, medical history, history of AVH, paranoia or delusional thoughts and other unusual behaviors. Pt's father stated that all pt needs is "more social interactions". He was unsure of which medications pt has been on in the past, unsure of past diagnoses, but then added that all mental health diagnoses in the past were wrong. He ended call by stating that he would like to meet in person with pt's treatment team to make sure that pt "is not given too many medicines like they did before at another hospital."  Father was not helpful in providing collateral information on symptoms and other medical background information.  POA/Legal Guardian: Patient is her own LG.  HPI: During encounter with pt, thoughts are disorganized, she is thought blocking, endorses visual hallucinations, then recounts this when asked what she sees. She endorses thought withdrawal and thought insertion, and is unable to state what types of thoughts are inserted or withdrawn from her brains.   She reports racing thoughts and feeling "weird", admits to depressive symptoms of insomnia, feelings of guilt about "religious stuff", reports decreased energy levels, poor concentration levels, decreased appetite and feelings of frustration for at least the past 2 weeks. She states that she is stressed, states: "Just the stress of life. Just life. I don't know what to do. Should I be a drawer or an animator?" She endorses paranoia, states that she feels as though people in general are out to harm her, states she  ignores it, then states: "Demonic stuff, it's draining".  Pt denies any history of manic type symptoms, denies panic attacks, PTSD type symptoms, OCD  type symptoms in the past or most recently. She denies any  history of physical/emotional/sexual abuse in the past or most recently. She denies self injurious behaviors, but as per chart review, she has a history of self injurious behaviors. She is however, a poor historian, and might not be accurately reporting her symptoms.  Current Presentation: Pt with flat affect and depressed mood, attention to personal hygiene and grooming is fair, eye contact is good, speech is clear & coherent. Thought contents are organized and logical, and pt currently denies SI/HI/AH. She reports VH, paranoia and some delusional thinking as above.  Past Psychiatric Hx: Previous Psych Diagnoses: bipolar d/o Prior inpatient treatment: 01/09/2018 at [facility] Current/prior outpatient treatment: none  Prior rehab NW:GNFA  Psychotherapy OZ:HYQM  History of suicide attempt: Denies  History of homicide or aggression: Denies  Psychiatric medication history: AS per chart review, Lamictal, Zyprexa, Hydroxyzine  Psychiatric medication compliance history:non compliance  Neuromodulation history: none  Current Psychiatrist: Denies having one Current therapist: one   Substance Abuse Hx: Denies all substance use   Past Medical History: Medical Diagnoses:denies  Home VH:QIONGE  Prior Hosp: denies  Prior Surgeries/Trauma:denies  Head trauma, LOC, concussions, seizures: Denies  Allergies:Amoxicillin causes Hives and Itching  XBM:WUXLKG to recall Contraception:None  MWN:UUVO   Family History: Medical:Unable to recall Psych:denies  Psych ZD:GUYQIH  SA/HA:denies Substance use family KV:QQVZDG   Social History: Pt reports highest level of education as being high school, states that she resides with her parents and brother here in [place], loves to draw and listen to music, is Catholic by religion, is single and with no children and is heterosexual. She states that she was born and raised in [place]. She reports that she does not work, does not have a peer support, but parents and  brother are supportive.  Associated Signs/Symptoms: Depression Symptoms:  depressed mood, anhedonia, insomnia, fatigue, difficulty concentrating, anxiety, decreased appetite, (Hypo) Manic Symptoms:  Distractibility, Anxiety Symptoms:   n/a Psychotic Symptoms:  Delusions, Hallucinations: Visual Ideas of Reference, Paranoia, PTSD Symptoms: NA Total Time spent with patient: 1.5 hours  Is the patient at risk to self? Yes.    Has the patient been a risk to self in the past 6 months? No.  Has the patient been a risk to self within the distant past? Yes.    Is the patient a risk to others? No.  Has the patient been a risk to others in the past 6 months? No.  Has the patient been a risk to others within the distant past? No.   [place] Scale:  Flowsheet Row [facility] (Current) [facility] from 11/01/2022 in [facility] at [facility] ED from 10/31/2022 in [facility]  C-SSRS RISK CATEGORY No Risk No Risk No Risk        Alcohol Screening: Patient refused Alcohol Screening Tool: Yes Substance Abuse History in the last 12 months:  No. Consequences of Substance Abuse: NA Previous Psychotropic Medications: Yes  Psychological Evaluations: No  Past Medical History:  Past Medical History:  Diagnosis Date   Anxiety    Bipolar disorder    Deliberate self-cutting    Depression    No past surgical history on file. Family History:  Family History  Problem Relation Age of Onset   Cancer Other  Diabetes Paternal Grandmother    Family Psychiatric  History: n/a Tobacco Screening:  Social History   Tobacco Use  Smoking Status Never  Smokeless Tobacco Never    BH Tobacco Counseling     Are you interested in Tobacco Cessation Medications?  N/A, patient does not use tobacco products Counseled patient on smoking cessation:  N/A, patient does not use tobacco  products Reason Tobacco Screening Not Completed: No value filed.       Social History:  Social History   Substance and Sexual Activity  Alcohol Use Never   Alcohol/week: 0.0 standard drinks of alcohol     Social History   Substance and Sexual Activity  Drug Use Not Currently   Types: Solvent inhalants    Additional Social History: Marital status: Single Are you sexually active?: No Has your sexual activity been affected by drugs, alcohol, medication, or emotional stress?: n/a Does patient have children?: No    Allergies:   Allergies  Allergen Reactions   Amoxicillin Hives and Itching   Lab Results: No results found for this or any previous visit (from the past 48 hour(s)).  Blood Alcohol level:  Lab Results  Component Value Date   ETH <10 11/01/2022   ETH <10 11/05/2019    Metabolic Disorder Labs:  Lab Results  Component Value Date   HGBA1C 4.9 01/16/2018   MPG 93.93 01/16/2018   MPG 103 08/26/2015   Lab Results  Component Value Date   PROLACTIN 29.5 (H) 01/10/2018   Lab Results  Component Value Date   CHOL 120 01/16/2018   TRIG 57 01/16/2018   HDL 54 01/16/2018   CHOLHDL 2.2 01/16/2018   VLDL 11 01/16/2018   LDLCALC 55 01/16/2018   LDLCALC 53 08/26/2015    Current Medications: Current Facility-Administered Medications  Medication Dose Route Frequency Provider Last Rate Last Admin   acetaminophen (TYLENOL) tablet 650 mg  650 mg Oral Q6H PRN Onuoha, Chinwendu V, NP       alum & mag hydroxide-simeth (MAALOX/MYLANTA) 200-200-20 MG/5ML suspension 30 mL  30 mL Oral Q4H PRN Onuoha, Chinwendu V, NP       haloperidol (HALDOL) tablet 5 mg  5 mg Oral TID PRN Massengill, Harrold Donath, MD       And   LORazepam (ATIVAN) tablet 2 mg  2 mg Oral TID PRN Massengill, Harrold Donath, MD       And   diphenhydrAMINE (BENADRYL) capsule 50 mg  50 mg Oral TID PRN Massengill, Harrold Donath, MD       haloperidol lactate (HALDOL) injection 5 mg  5 mg Intramuscular TID PRN Massengill, Harrold Donath,  MD       And   LORazepam (ATIVAN) injection 2 mg  2 mg Intramuscular TID PRN Massengill, Harrold Donath, MD       And   diphenhydrAMINE (BENADRYL) injection 50 mg  50 mg Intramuscular TID PRN Massengill, Harrold Donath, MD       hydrOXYzine (ATARAX) tablet 25 mg  25 mg Oral TID PRN Onuoha, Chinwendu V, NP   25 mg at 11/04/22 1716   magnesium hydroxide (MILK OF MAGNESIA) suspension 30 mL  30 mL Oral Daily PRN Onuoha, Chinwendu V, NP       risperiDONE (RISPERDAL M-TABS) disintegrating tablet 1 mg  1 mg Oral Q12H Massengill, Nathan, MD       traZODone (DESYREL) tablet 50 mg  50 mg Oral QHS PRN Onuoha, Chinwendu V, NP       PTA Medications: Medications Prior to Admission  Medication Sig Dispense  Refill Last Dose   hydrOXYzine (ATARAX) 25 MG tablet Take 1 tablet at bedtime as needed for insomnia. (Patient not taking: Reported on 11/01/2022) 30 tablet 0    OLANZapine (ZYPREXA) 5 MG tablet Take 1 tablet (5 mg total) by mouth in the morning and at bedtime. (Patient not taking: Reported on 11/01/2022) 60 tablet 0    Musculoskeletal: Strength & Muscle Tone: within normal limits Gait & Station: normal Patient leans: N/A   Psychiatric Specialty Exam:  Presentation  General Appearance:  Disheveled  Eye Contact: Fair  Speech: Blocked  Speech Volume: Decreased  Handedness: Right   Mood and Affect  Mood: Depressed  Affect: Congruent   Thought Process  Thought Processes: Disorganized  Duration of Psychotic Symptoms: >1 year Past Diagnosis of Schizophrenia or Psychoactive disorder: No data recorded Descriptions of Associations:Tangential  Orientation:Full (Time, Place and Person)  Thought Content:Illogical  Hallucinations:Hallucinations: Visual  Ideas of Reference:Paranoia; Delusions  Suicidal Thoughts:Suicidal Thoughts: No  Homicidal Thoughts:Homicidal Thoughts: No   Sensorium  Memory: Immediate Good  Judgment: Poor  Insight: Poor   Executive Functions   Concentration: Poor  Attention Span: Fair  Recall: Good  Fund of Knowledge: Poor  Language: Fair   Psychomotor Activity  Psychomotor Activity:Psychomotor Activity: Normal   Assets  Assets: Social Support   Sleep  Sleep:Sleep: Poor    Physical Exam: Physical Exam Constitutional:      Appearance: Normal appearance.  HENT:     Head: Normocephalic.     Nose: Nose normal. No congestion or rhinorrhea.  Pulmonary:     Effort: Pulmonary effort is normal.  Musculoskeletal:        General: Normal range of motion.     Cervical back: Normal range of motion.  Neurological:     Mental Status: She is alert and oriented to person, place, and time.    Review of Systems  Constitutional:  Negative for fever.  HENT:  Negative for hearing loss.   Eyes:  Negative for blurred vision.  Respiratory:  Negative for cough.   Cardiovascular:  Negative for chest pain.  Genitourinary:  Negative for dysuria.  Musculoskeletal:  Negative for myalgias.  Skin:  Negative for rash.  Neurological:  Negative for dizziness.  Psychiatric/Behavioral:  Positive for depression and hallucinations. Negative for memory loss, substance abuse and suicidal ideas. The patient is nervous/anxious and has insomnia.    Blood pressure (!) 140/86, pulse (!) 107, temperature 98.4 F (36.9 C), resp. rate 20, height 5\' 9"  (1.753 m), weight 109.3 kg, last menstrual period 10/29/2022, SpO2 100 %. Body mass index is 35.59 kg/m. Treatment Plan Summary: Daily contact with patient to assess and evaluate symptoms and progress in treatment and Medication management  Observation Level/Precautions:  15 minute checks  Laboratory:  Labs reviewed :First time psychosis work up [employer] see Labs ordered  Psychotherapy:  Unit Group sessions  Medications:  See [facility]  Consultations:  To be determined   Discharge Concerns:  Safety, medication compliance, mood stability  Estimated LOS: 5-7 days  Other:  N/A     PLAN Safety and Monitoring: Voluntary admission to inpatient psychiatric unit for safety, stabilization and treatment Daily contact with patient to assess and evaluate symptoms and progress in treatment Patient's case to be discussed in multi-disciplinary team meeting Observation Level : q15 minute checks Vital signs: q12 hours Precautions: Safety   Long Term Goal(s): Improvement in symptoms so as ready for discharge  Short Term Goals: Ability to identify changes in lifestyle to reduce recurrence of condition  will improve, Ability to demonstrate self-control will improve, Ability to identify and develop effective coping behaviors will improve, and Compliance with prescribed medications will improve  Diagnoses  Principal Problem:   Schizophrenia spectrum disorder with psychotic disorder type not yet determined Active Problems:   Insomnia   Anxiety state  Medications -Start Risperdal 1 mg BID for psychosis  -Continue Trazodone 50 mg nightly PRN for sleep -Continue Hydroxyzine 25 mg TID PRN for anxiety -Agitation Protocol: Start Benadryl/Ativan/Haldol PRN-See MAR for complete order  Other PRNS -Continue Tylenol 650 mg every 6 hours PRN for mild pain -Continue Maalox 30 mg every 4 hrs PRN for indigestion -Continue Milk of Magnesia as needed every 6 hrs for constipation  Discharge Planning: Social work and case management to assist with discharge planning and identification of hospital follow-up needs prior to discharge Estimated LOS: 5-7 days Discharge Concerns: Need to establish a safety plan; Medication compliance and effectiveness Discharge Goals: Return home with outpatient referrals for mental health follow-up including medication management/psychotherapy  I certify that inpatient services furnished can reasonably be expected to improve the patient's condition.    Starleen Blue, NP 4/24/20246:50 PM

## 2022-11-04 NOTE — BH IP Treatment Plan (Signed)
Interdisciplinary Treatment and Diagnostic Plan Update  11/04/2022 Time of Session: 11:25 AM  Kara Nguyen MRN: [id number]  Principal Diagnosis: Bipolar disorder, manic  Secondary Diagnoses: Principal Problem:   Bipolar disorder, manic   Current Medications:  Current Facility-Administered Medications  Medication Dose Route Frequency Provider Last Rate Last Admin   acetaminophen (TYLENOL) tablet 650 mg  650 mg Oral Q6H PRN Onuoha, Chinwendu V, NP       alum & mag hydroxide-simeth (MAALOX/MYLANTA) 200-200-20 MG/5ML suspension 30 mL  30 mL Oral Q4H PRN Onuoha, Chinwendu V, NP       haloperidol (HALDOL) tablet 5 mg  5 mg Oral TID PRN Massengill, Harrold Donath, MD       And   LORazepam (ATIVAN) tablet 2 mg  2 mg Oral TID PRN Massengill, Harrold Donath, MD       And   diphenhydrAMINE (BENADRYL) capsule 50 mg  50 mg Oral TID PRN Massengill, Harrold Donath, MD       haloperidol lactate (HALDOL) injection 5 mg  5 mg Intramuscular TID PRN Massengill, Harrold Donath, MD       And   LORazepam (ATIVAN) injection 2 mg  2 mg Intramuscular TID PRN Massengill, Harrold Donath, MD       And   diphenhydrAMINE (BENADRYL) injection 50 mg  50 mg Intramuscular TID PRN Massengill, Harrold Donath, MD       hydrOXYzine (ATARAX) tablet 25 mg  25 mg Oral TID PRN Onuoha, Chinwendu V, NP       magnesium hydroxide (MILK OF MAGNESIA) suspension 30 mL  30 mL Oral Daily PRN Onuoha, Chinwendu V, NP       risperiDONE (RISPERDAL M-TABS) disintegrating tablet 1 mg  1 mg Oral Q12H Massengill, Nathan, MD       traZODone (DESYREL) tablet 50 mg  50 mg Oral QHS PRN Onuoha, Chinwendu V, NP       PTA Medications: Medications Prior to Admission  Medication Sig Dispense Refill Last Dose   hydrOXYzine (ATARAX) 25 MG tablet Take 1 tablet at bedtime as needed for insomnia. (Patient not taking: Reported on 11/01/2022) 30 tablet 0    OLANZapine (ZYPREXA) 5 MG tablet Take 1 tablet (5 mg total) by mouth in the morning and at bedtime. (Patient not taking: Reported on 11/01/2022)  60 tablet 0     Patient Stressors: Health problems   Medication change or noncompliance    Patient Strengths: Religious Affiliation  Supportive family/friends   Treatment Modalities: Medication Management, Group therapy, Case management,  1 to 1 session with clinician, Psychoeducation, Recreational therapy.   Physician Treatment Plan for Primary Diagnosis: Bipolar disorder, manic Long Term Goal(s):     Short Term Goals:    Medication Management: Evaluate patient's response, side effects, and tolerance of medication regimen.  Therapeutic Interventions: 1 to 1 sessions, Unit Group sessions and Medication administration.  Evaluation of Outcomes: Not Progressing  Physician Treatment Plan for Secondary Diagnosis: Principal Problem:   Bipolar disorder, manic  Long Term Goal(s):     Short Term Goals:       Medication Management: Evaluate patient's response, side effects, and tolerance of medication regimen.  Therapeutic Interventions: 1 to 1 sessions, Unit Group sessions and Medication administration.  Evaluation of Outcomes: Not Progressing   RN Treatment Plan for Primary Diagnosis: Bipolar disorder, manic Long Term Goal(s): Knowledge of disease and therapeutic regimen to maintain health will improve  Short Term Goals: Ability to remain free from injury will improve, Ability to verbalize frustration and anger appropriately will improve, Ability to  demonstrate self-control, Ability to participate in decision making will improve, Ability to verbalize feelings will improve, Ability to disclose and discuss suicidal ideas, Ability to identify and develop effective coping behaviors will improve, and Compliance with prescribed medications will improve  Medication Management: RN will administer medications as ordered by provider, will assess and evaluate patient's response and provide education to patient for prescribed medication. RN will report any adverse and/or side effects to  prescribing provider.  Therapeutic Interventions: 1 on 1 counseling sessions, Psychoeducation, Medication administration, Evaluate responses to treatment, Monitor vital signs and CBGs as ordered, Perform/monitor CIWA, COWS, AIMS and Fall Risk screenings as ordered, Perform wound care treatments as ordered.  Evaluation of Outcomes: Not Progressing   LCSW Treatment Plan for Primary Diagnosis: Bipolar disorder, manic Long Term Goal(s): Safe transition to appropriate next level of care at discharge, Engage patient in therapeutic group addressing interpersonal concerns.  Short Term Goals: Engage patient in aftercare planning with referrals and resources, Increase social support, Increase ability to appropriately verbalize feelings, Increase emotional regulation, Facilitate acceptance of mental health diagnosis and concerns, Facilitate patient progression through stages of change regarding substance use diagnoses and concerns, Identify triggers associated with mental health/substance abuse issues, and Increase skills for wellness and recovery  Therapeutic Interventions: Assess for all discharge needs, 1 to 1 time with Social worker, Explore available resources and support systems, Assess for adequacy in community support network, Educate family and significant other(s) on suicide prevention, Complete Psychosocial Assessment, Interpersonal group therapy.  Evaluation of Outcomes: Not Progressing   Progress in Treatment: Attending groups: No. Participating in groups: No. Taking medication as prescribed: Yes. Toleration medication: Yes. Family/Significant other contact made: No, will contact:  Dorena Bodo (father)- [phone]   Adalina Zadie Rhine (mother)     Minerva Areola (brother) [phone] Patient understands diagnosis: No. Discussing patient identified problems/goals with staff: Yes. Medical problems stabilized or resolved: Yes. Denies suicidal/homicidal ideation: No. Issues/concerns per  patient self-inventory: No.   New problem(s) identified: No, Describe:  None reported  New Short Term/Long Term Goal(s): medication stabilization, elimination of SI thoughts, development of comprehensive mental wellness plan.    Patient Goals:  " family guidance "   Discharge Plan or Barriers: Patient recently admitted. CSW will continue to follow and assess for appropriate referrals and possible discharge planning.    Reason for Continuation of Hospitalization: Anxiety Depression Mania Medication stabilization Suicidal ideation  Estimated Length of Stay:5-7 days  Last 3 [place] Suicide Severity Risk Score: Flowsheet Row Admission (Current) [facility] from 11/01/2022 in [facility] ED from 10/31/2022 in [facility]  C-SSRS RISK CATEGORY No Risk No Risk No Risk       Last [facility] 2/9 Scores:     No data to display          Scribe for Treatment Team: Beather Arbour 11/04/2022 2:43 PM

## 2022-11-04 NOTE — Progress Notes (Signed)
Hand off report given to Mike, RN.

## 2022-11-05 DIAGNOSIS — F29 Unspecified psychosis not due to a substance or known physiological condition: Secondary | ICD-10-CM | POA: Diagnosis not present

## 2022-11-05 LAB — HIV ANTIBODY (ROUTINE TESTING W REFLEX): HIV Screen 4th Generation wRfx: NONREACTIVE

## 2022-11-05 LAB — VITAMIN D 25 HYDROXY (VIT D DEFICIENCY, FRACTURES): Vit D, 25-Hydroxy: 30.76 ng/mL (ref 30–100)

## 2022-11-05 LAB — LIPID PANEL
Cholesterol: 118 mg/dL (ref 0–200)
HDL: 49 mg/dL (ref >40)
LDL Cholesterol: 57 mg/dL (ref 0–99)
Total CHOL/HDL Ratio: 2.4 RATIO
Triglycerides: 59 mg/dL (ref <150)
VLDL: 12 mg/dL (ref 0–40)

## 2022-11-05 MED ORDER — RISPERIDONE 1 MG PO TBDP
1.5000 mg | ORAL_TABLET | Freq: Two times a day (BID) | ORAL | Status: DC
  Administered 2022-11-05 – 2022-11-08 (×6): 1.5 mg via ORAL
  Filled 2022-11-05 (×11): qty 1

## 2022-11-05 NOTE — Progress Notes (Signed)
Pt coming out the room appearing to respond to internal stimuli, pt re-directed to her room

## 2022-11-05 NOTE — Progress Notes (Signed)
Pt stated she had a better day, felt a little better this evening    11/05/22 2100  Psych Admission Type (Psych Patients Only)  Admission Status Voluntary  Psychosocial Assessment  Patient Complaints Anxiety  Eye Contact Brief  Facial Expression Anxious;Animated  Affect Anxious  Speech Tangential;Language other than English  Interaction Cautious;Childlike  Motor Activity Fidgety  Appearance/Hygiene Unremarkable  Behavior Characteristics Cooperative  Mood Suspicious;Preoccupied  Aggressive Behavior  Effect No apparent injury  Thought Process  Coherency Tangential  Content Religiosity;Preoccupation  Delusions Paranoid  Perception Hallucinations  Hallucination Auditory  Judgment Impaired  Confusion Mild  Danger to Self  Current suicidal ideation? Denies

## 2022-11-05 NOTE — Progress Notes (Addendum)
[facility] MD Progress Note  11/05/2022 5:15 PM Kara Nguyen  MRN:  [id number]  Subjective:   Kara Nguyen is a 22 y.o. female  who presented to the [facility] on 04/24 with complaints of racing thoughts, insomnia, hyper religiosity, and "mania". Pt previously diagnoses with bipolar d/o, anxiety & depression.  Patient was transferred voluntarily on 02/23 to this [facility] for treatment and stabilization of her mental status.   Yesterday the psychiatry team made the following recommendations: Stop Zyprexa 5 mg Start Risperdal 1 mg twice daily for psychosis  On my exam today, the patient continues to have bizarre thinking.  She states that she did not sleep well last night "because I am able to connect the dots to everything".  When asked to elaborate further the patient responded "I am not sure.  I woke up and felt that [thought blocking...]". when asked to describe details of the visit with her father yesterday during visiting hours she states that it was "calm". Per nursing, the patient received agitation protocol last night because she was responding to internal stimuli, pacing, having bizarre behavior. She reports that her mood is anxious.  She appetite is okay.  Concentration is poor.  She continues to have bizarre and irrational responses to simple questions.  She is able to elaborate on the history of what is described to be manic type episodes in the past.  She does confirm previous history of bipolar disorder diagnosis. On my time she does not appear to be responding to internal stimuli and she denies having any AH or VH, as reported by the nursing staff.  She denies having any side effects to starting the Risperdal is agreeable with dose increase. No EPS on my physical exam.  She does continue to deny using any substances including marijuana, delta 8, preceding this admission.  Principal Problem: Schizophrenia spectrum disorder with psychotic disorder type not yet determined Diagnosis:  Principal Problem:   Schizophrenia spectrum disorder with psychotic disorder type not yet determined Active Problems:   Insomnia   Anxiety state  Total Time spent with patient: 15 minutes  Past Psychiatric History:  Previous Psych Diagnoses: bipolar d/o Prior inpatient treatment: 01/09/2018 at [facility] Current/prior outpatient treatment: none  Prior rehab JJ:[facility]  Psychotherapy YS:AYTK  History of suicide attempt: Denies  History of homicide or aggression: Denies  Psychiatric medication history: AS per chart review, Lamictal, Zyprexa, Hydroxyzine  Psychiatric medication compliance history:non compliance  Neuromodulation history: none  Current Psychiatrist: Denies having one Current therapist: one   Past Medical History:  Past Medical History:  Diagnosis Date   Anxiety    Bipolar disorder    Deliberate self-cutting    Depression    No past surgical history on file. Family History:  Family History  Problem Relation Age of Onset   Cancer Other    Diabetes Paternal Grandmother    Family Psychiatric  History: See H&P  Social History:  Social History   Substance and Sexual Activity  Alcohol Use Never   Alcohol/week: 0.0 standard drinks of alcohol     Social History   Substance and Sexual Activity  Drug Use Not Currently   Types: Solvent inhalants    Social History   Socioeconomic History   Marital status: Single    Spouse name: Not on file   Number of children: 0   Years of education: Not on file   Highest education level: 10th grade  Occupational History   Occupation: Student  Tobacco Use   Smoking status: Never  Smokeless tobacco: Never  Vaping Use   Vaping Use: Never used  Substance and Sexual Activity   Alcohol use: Never    Alcohol/week: 0.0 standard drinks of alcohol   Drug use: Not Currently    Types: Solvent inhalants   Sexual activity: Never  Other Topics Concern   Not on file  Social History Narrative   Pt is an 11th grader at [employer].  She lives with mother, father, and siblings.  Pt is followed by [facility] outpatient psychiatry for treatment of Bipolar I.   Social Determinants of Health   Financial Resource Strain: Not on file  Food Insecurity: Patient Declined (11/03/2022)   Hunger Vital Sign    Worried About Running Out of Food in the Last Year: Patient declined    Ran Out of Food in the Last Year: Patient declined  Transportation Needs: Patient Declined (11/03/2022)   PRAPARE - Administrator, Civil Service (Medical): Patient declined    Lack of Transportation (Non-Medical): Patient declined  Physical Activity: Not on file  Stress: Not on file  Social Connections: Not on file   Additional Social History:                           Current Medications: Current Facility-Administered Medications  Medication Dose Route Frequency Provider Last Rate Last Admin   acetaminophen (TYLENOL) tablet 650 mg  650 mg Oral Q6H PRN Onuoha, Chinwendu V, NP       alum & mag hydroxide-simeth (MAALOX/MYLANTA) 200-200-20 MG/5ML suspension 30 mL  30 mL Oral Q4H PRN Onuoha, Chinwendu V, NP       haloperidol (HALDOL) tablet 5 mg  5 mg Oral TID PRN Phineas Inches, MD   5 mg at 11/05/22 0034   And   LORazepam (ATIVAN) tablet 2 mg  2 mg Oral TID PRN Phineas Inches, MD   2 mg at 11/05/22 0033   And   diphenhydrAMINE (BENADRYL) capsule 50 mg  50 mg Oral TID PRN Phineas Inches, MD   50 mg at 11/05/22 0034   haloperidol lactate (HALDOL) injection 5 mg  5 mg Intramuscular TID PRN Yamilee Harmes, Harrold Donath, MD       And   LORazepam (ATIVAN) injection 2 mg  2 mg Intramuscular TID PRN Britain Anagnos, Harrold Donath, MD       And   diphenhydrAMINE (BENADRYL) injection 50 mg  50 mg Intramuscular TID PRN Dravin Lance, Harrold Donath, MD       hydrOXYzine (ATARAX) tablet 25 mg  25 mg Oral TID PRN Onuoha, Chinwendu V, NP   25 mg at 11/05/22 0827   magnesium hydroxide (MILK OF MAGNESIA) suspension 30 mL  30 mL Oral Daily PRN  Onuoha, Chinwendu V, NP       risperiDONE (RISPERDAL M-TABS) disintegrating tablet 1 mg  1 mg Oral Q12H Rodnesha Elie, MD   1 mg at 11/05/22 0827   traZODone (DESYREL) tablet 50 mg  50 mg Oral QHS PRN Onuoha, Chinwendu V, NP   50 mg at 11/04/22 2029    Lab Results:  Results for orders placed or performed during the hospital encounter of 11/03/22 (from the past 48 hour(s))  Urinalysis, Routine w reflex microscopic -Urine, Clean Catch     Status: Abnormal   Collection Time: 11/04/22  9:01 AM  Result Value Ref Range   Color, Urine YELLOW YELLOW   APPearance CLOUDY (A) CLEAR   Specific Gravity, Urine 1.024 1.005 - 1.030   pH  7.0 5.0 - 8.0   Glucose, UA NEGATIVE NEGATIVE mg/dL   Hgb urine dipstick NEGATIVE NEGATIVE   Bilirubin Urine NEGATIVE NEGATIVE   Ketones, ur NEGATIVE NEGATIVE mg/dL   Protein, ur NEGATIVE NEGATIVE mg/dL   Nitrite NEGATIVE NEGATIVE   Leukocytes,Ua NEGATIVE NEGATIVE    Comment: Performed at [facility], [address] [place], [place] [address]  TSH     Status: None   Collection Time: 11/04/22  6:22 PM  Result Value Ref Range   TSH 0.662 0.350 - 4.500 uIU/mL    Comment: Performed by a 3rd Generation assay with a functional sensitivity of <=0.01 uIU/mL. Performed at [facility], [address] [place], [place] [address]   HIV Antibody (routine testing w rflx)     Status: None   Collection Time: 11/04/22  6:22 PM  Result Value Ref Range   HIV Screen 4th Generation wRfx Non Reactive Non Reactive    Comment: Performed at [facility], [address], [place], [place] [address]  VITAMIN D 25 Hydroxy (Vit-D Deficiency, Fractures)     Status: None   Collection Time: 11/04/22  6:22 PM  Result Value Ref Range   Vit D, 25-Hydroxy 30.76 30 - 100 ng/mL    Comment: (NOTE) Vitamin D deficiency has been defined by the Institute of Medicine  and an Endocrine Society practice guideline as a level of serum 25-OH  vitamin D  less than 20 ng/mL (1,2). The Endocrine Society went on to  further define vitamin D insufficiency as a level between 21 and 29  ng/mL (2).  1. IOM (Institute of Medicine). 2010. Dietary reference intakes for  calcium and D. [place]: [employer]. 2. Holick MF, Binkley [place], Bischoff-Ferrari HA, et al. Evaluation,  treatment, and prevention of vitamin D deficiency: an Endocrine  Society clinical practice guideline, JCEM. 2011 Jul; 96(7): 1911-30.  Performed at [facility], [address], [place], [place] [address]     Blood Alcohol level:  Lab Results  Component Value Date   [facility] <10 11/01/2022   ETH <10 11/05/2019    Metabolic Disorder Labs: Lab Results  Component Value Date   HGBA1C 4.9 01/16/2018   MPG 93.93 01/16/2018   MPG 103 08/26/2015   Lab Results  Component Value Date   PROLACTIN 29.5 (H) 01/10/2018   Lab Results  Component Value Date   CHOL 120 01/16/2018   TRIG 57 01/16/2018   HDL 54 01/16/2018   CHOLHDL 2.2 01/16/2018   VLDL 11 01/16/2018   LDLCALC 55 01/16/2018   LDLCALC 53 08/26/2015    Physical Findings: AIMS: Facial and Oral Movements Muscles of Facial Expression: None, normal Lips and Perioral Area: None, normal Jaw: None, normal Tongue: None, normal,Extremity Movements Upper (arms, wrists, hands, fingers): None, normal Lower (legs, knees, ankles, toes): None, normal, Trunk Movements Neck, shoulders, hips: None, normal, Overall Severity Severity of abnormal movements (highest score from questions above): None, normal Incapacitation due to abnormal movements: None, normal Patient's awareness of abnormal movements (rate only patient's report): No Awareness, Dental Status Current problems with teeth and/or dentures?: No Does patient usually wear dentures?: No  CIWA:    COWS:     Musculoskeletal: Strength & Muscle Tone: Lying in bed Gait & Station: Lying in bed Patient leans: Lying in bed  Psychiatric Specialty  Exam:  Presentation  General Appearance:  Disheveled  Eye Contact: Fair  Speech: Blocked  Speech Volume: Decreased  Handedness: Right  Mood and Affect  Mood: Depressed  Affect: Congruent   Thought Process  Thought Processes: Disorganized  Descriptions of Associations:Tangential  Orientation:Full (Time, Place and Person)  Thought Content:Illogical  History of Schizophrenia/Schizoaffective disorder:No data recorded no Per collateral and medical record Duration of Psychotic Symptoms:No data recorded unclear, per collateral information Hallucinations:Hallucinations: Visual  Ideas of Reference:Paranoia; Delusions  Suicidal Thoughts:Suicidal Thoughts: No  Homicidal Thoughts:Homicidal Thoughts: No   Sensorium  Memory: Immediate Good  Judgment: Poor  Insight: Poor   Executive Functions  Concentration: Poor  Attention Span: Fair  Recall: Good  Fund of Knowledge: Poor  Language: Fair   Psychomotor Activity  Psychomotor Activity: Psychomotor Activity: Normal   Assets  Assets: Social Support   Sleep  Sleep: Sleep: Poor    Physical Exam: Physical Exam Vitals reviewed.  Constitutional:      Appearance: She is normal weight.  Pulmonary:     Effort: Pulmonary effort is normal.  Neurological:     Mental Status: She is alert.     Motor: No weakness.     Gait: Gait normal.  Psychiatric:        Behavior: Behavior normal.    Review of Systems  Constitutional:  Negative for chills and fever.  Cardiovascular:  Negative for chest pain and palpitations.  Neurological:  Negative for dizziness, tingling, tremors and headaches.  Psychiatric/Behavioral:  Negative for depression, hallucinations, memory loss, substance abuse and suicidal ideas. The patient is nervous/anxious and has insomnia.   All other systems reviewed and are negative.  Blood pressure (!) 111/48, pulse (!) 106, temperature 99.2 F (37.3 C), temperature source  Oral, resp. rate 20, height 5\' 9"  (1.753 m), weight 109.3 kg, last menstrual period 10/29/2022, SpO2 100 %. Body mass index is 35.59 kg/m.   Treatment Plan Summary: Daily contact with patient to assess and evaluate symptoms and progress in treatment and Medication management  ASSESSMENT:  Diagnoses / Active Problems: Brief psychotic disorder History of bipolar disorder   PLAN: Safety and Monitoring:  --  Voluntary admission to inpatient psychiatric unit for safety, stabilization and treatment  -- Daily contact with patient to assess and evaluate symptoms and progress in treatment  -- Patient's case to be discussed in multi-disciplinary team meeting  -- Observation Level : q15 minute checks  -- Vital signs:  q12 hours  -- Precautions: suicide, elopement, and assault  2. Psychiatric Diagnoses and Treatment:     Increase Risperdal from 1 mg twice daily to 1.5 mg twice daily.  Goal for LAI  --  The risks/benefits/side-effects/alternatives to this medication were discussed in detail with the patient and time was given for questions. The patient consents to medication trial.    -- Metabolic profile and EKG monitoring obtained while on an atypical antipsychotic (BMI: Lipid Panel: HbgA1c: QTc:)   -- Encouraged patient to participate in unit milieu and in scheduled group therapies      3. Medical Issues Being Addressed:      4. Discharge Planning:   -- Social work and case management to assist with discharge planning and identification of hospital follow-up needs prior to discharge  -- Estimated LOS: 5-7 days  -- Discharge Concerns: Need to establish a safety plan; Medication compliance and effectiveness  -- Discharge Goals: Return home with outpatient referrals for mental health follow-up including medication management/psychotherapy  Cristy Hilts, MD 11/05/2022, 5:15 PM  Total Time Spent in Direct Patient Care:  I personally spent 35 minutes on the unit in direct  patient care. The  direct patient care time included face-to-face time with the patient, reviewing the patient's chart, communicating with other professionals, and coordinating care. Greater than 50% of this time was spent in counseling or coordinating care with the patient regarding goals of hospitalization, psycho-education, and discharge planning needs.   Phineas Inches, MD Psychiatrist

## 2022-11-05 NOTE — Group Note (Signed)
Recreation Therapy Group Note   Group Topic:Healthy Decision Making  Group Date: 11/05/2022 Start Time: 1015 End Time: 1057 Facilitators: Seth Friedlander-McCall, LRT,CTRS Location: [address] Dayroom   Goal Area(s) Addresses:  Patient will effectively work with peer towards shared goal.  Patient will identify factors that guided their decision making.  Patient will pro-socially communicate ideas during group session.   Group Description: Patients were given a scenario that they were going to be stranded on a deserted [place] for several months before being rescued. Writer tasked them with making a list of 15 things they would choose to bring with them for "survival". The list of items was prioritized most important to least. Each patient would come up with their own list, then work together to create a new list of 15 items while in a group of 3-5 peers. LRT discussed each person's list and how it differed from others. The debrief included discussion of priorities, good decisions versus bad decisions, and how it is important to think before acting so we can make the best decision possible. LRT tied the concept of effective communication among group members to patient's support systems outside of the hospital and its benefit post discharge.   Affect/Mood: N/A   Participation Level: Did not attend    Clinical Observations/Individualized Feedback:      Plan: Continue to engage patient in RT group sessions 2-3x/week.   Chelsa Stout-McCall, LRT,CTRS  11/05/2022 12:47 PM

## 2022-11-05 NOTE — Progress Notes (Signed)
Pt coming in and out of their room, walking backwards, twirling as they walk. Pt attempted to open dayroom door. Pt is walking down the hall with their eyes closes and hands up in a "praising" motion. Writer was unable to redirect pt. RN notified.

## 2022-11-05 NOTE — Group Note (Signed)
Occupational Therapy Group Note  Group Topic:Coping Skills  Group Date: 11/05/2022 Start Time: 1400 End Time: 1430 Facilitators: Ted Mcalpine, OT   Group Description: Group encouraged increased engagement and participation through discussion and activity focused on "Coping Ahead." Patients were split up into teams and selected a card from a stack of positive coping strategies. Patients were instructed to act out/charade the coping skill for other peers to guess and receive points for their team. Discussion followed with a focus on identifying additional positive coping strategies and patients shared how they were going to cope ahead over the weekend while continuing hospitalization stay.  Therapeutic Goal(s): Identify positive vs negative coping strategies. Identify coping skills to be used during hospitalization vs coping skills outside of hospital/at home Increase participation in therapeutic group environment and promote engagement in treatment   Participation Level: Engaged   Participation Quality: Independent   Behavior: Appropriate   Speech/Thought Process: Relevant   Affect/Mood: Appropriate   Insight: Fair   Judgement: Fair      Modes of Intervention: Education  Patient Response to Interventions:  Engaged   Plan: Continue to engage patient in OT groups 2 - 3x/week.  11/05/2022  Ted Mcalpine, OT Kerrin Champagne, OT

## 2022-11-05 NOTE — Progress Notes (Signed)
Recreation Therapy Notes  INPATIENT RECREATION THERAPY ASSESSMENT  Patient Details Name: Geneva Barrero MRN: [id number] DOB: [birth year] Today's Date: 11/05/2022       Information Obtained From: Patient  Able to Participate in Assessment/Interview: Yes  Patient Presentation: Responsive, Alert  Reason for Admission (Per Patient): Other (Comments) ("too many emotions")  Patient Stressors: Other (Comment) ("life")  Coping Skills:   Isolation, Journal, Music, Deep Breathing, Meditate, Talk, Art, Avoidance  Leisure Interests (2+):  Art - Draw, Art - Other (Comment), Individual - Writing (Design clothes; Create stories)  Frequency of Recreation/Participation: Other (Comment) (Daily)  Awareness of Community Resources:  Yes  Community Resources:  Park, Engineering geologist  Current Use: Yes  If no, Barriers?:    Expressed Interest in [employer] Information: No  [employer] of Residence:  Engineer, technical sales  Patient Main Form of Transportation: Other (Comment) (Mom and Brother)  Patient Strengths:  Creativity  Patient Identified Areas of Improvement:  Motivation  Patient Goal for Hospitalization:  "try to get back to me/balance"  Current SI (including self-harm):  No  Current HI:  No  Current AVH: No  Staff Intervention Plan: Group Attendance, Collaborate with Interdisciplinary Treatment Team  Consent to Intern Participation: N/A   Zivah Mayr-McCall, LRT,CTRS Lateisha Thurlow A Aleka Twitty-McCall 11/05/2022, 1:47 PM

## 2022-11-05 NOTE — Progress Notes (Signed)
Pt coming out the room not receptive to re-direction at this time, pt appeared to be responding to internal stimuli, pt seemed a little agitated, pt given PRN agitation protocol PO

## 2022-11-05 NOTE — Progress Notes (Signed)
Pt presents animated with brief eye contact, spontaneous speech in comparison to yesterday. However, she continues to be restless, fidgety with arms / hands gestures. Reported "I slept fairly but good, like I had some weird movements. I was up walking in the room. I ate my food this morning". Remains guarded in her room, required multiple prompts to get out of her room. Denies SI, HI, AVH and pain when assessed. Safety checks maintained at Q 15 minutes intervals without outburst. Off unit to courtyard and meals with peers, returned without issues. Pt remains compliant with medications, denies adverse drug reactions.

## 2022-11-06 DIAGNOSIS — F29 Unspecified psychosis not due to a substance or known physiological condition: Secondary | ICD-10-CM | POA: Diagnosis not present

## 2022-11-06 LAB — CBC WITH DIFFERENTIAL/PLATELET
Abs Immature Granulocytes: 0.02 10*3/uL (ref 0.00–0.07)
Basophils Absolute: 0 10*3/uL (ref 0.0–0.1)
Basophils Relative: 1 %
Eosinophils Absolute: 0.4 10*3/uL (ref 0.0–0.5)
Eosinophils Relative: 6 %
HCT: 35.7 % — ABNORMAL LOW (ref 36.0–46.0)
Hemoglobin: 11.6 g/dL — ABNORMAL LOW (ref 12.0–15.0)
Immature Granulocytes: 0 %
Lymphocytes Relative: 42 %
Lymphs Abs: 2.8 10*3/uL (ref 0.7–4.0)
MCH: 29 pg (ref 26.0–34.0)
MCHC: 32.5 g/dL (ref 30.0–36.0)
MCV: 89.3 fL (ref 80.0–100.0)
Monocytes Absolute: 0.5 10*3/uL (ref 0.1–1.0)
Monocytes Relative: 7 %
Neutro Abs: 3 10*3/uL (ref 1.7–7.7)
Neutrophils Relative %: 44 %
Platelets: 315 10*3/uL (ref 150–400)
RBC: 4 MIL/uL (ref 3.87–5.11)
RDW: 13.2 % (ref 11.5–15.5)
WBC: 6.8 10*3/uL (ref 4.0–10.5)
nRBC: 0 % (ref 0.0–0.2)

## 2022-11-06 LAB — HEMOGLOBIN A1C
Hgb A1c MFr Bld: 4.8 % (ref 4.8–5.6)
Mean Plasma Glucose: 91.06 mg/dL

## 2022-11-06 LAB — VITAMIN B12: Vitamin B-12: 242 pg/mL (ref 180–914)

## 2022-11-06 LAB — SEDIMENTATION RATE: Sed Rate: 17 mm/hr (ref 0–22)

## 2022-11-06 NOTE — [facility] Group Notes (Signed)
Spirituality group facilitated by Kathleen Argue, BCC.  Group Description: Group focused on topic of hope. Patients participated in facilitated discussion around topic, connecting with one another around experiences and definitions for hope. Group members engaged with visual explorer photos, reflecting on what hope looks like for them today. Group engaged in discussion around how their definitions of hope are present today in hospital.  Modalities: Psycho-social ed, Adlerian, Narrative, MI  Patient Progress:  Kara Nguyen attended group and engaged and participated in group conversation and activities.  At times she struggled to find words for what she was thinking.  This did not appear to be related to language barrier, although it could have been.   [address], Bcc Pager, [phone]

## 2022-11-06 NOTE — Group Note (Signed)
Date:  11/06/2022 Time:  9:09 PM  Group Topic/Focus:  Wrap-Up Group:   The focus of this group is to help patients review their daily goal of treatment and discuss progress on daily workbooks.    Participation Level:  Active  Participation Quality:  Appropriate  Affect:  Appropriate  Cognitive:  Appropriate  Insight: Appropriate  Engagement in Group:  Engaged  Modes of Intervention:  Education  Additional Comments:  Patient attended and participated in group tonight. She reports that today getting to see her day was the most important thing for her today was seeing her dad.  Lita Mains [facility] 11/06/2022, 9:09 PM

## 2022-11-06 NOTE — Progress Notes (Signed)
Dar Note: Patient presents with blunted affect and bizarre behavior.  Patient observed responding to internal stimuli while in her room.  Patient visible more in milieu interacting with staff.  Medication given as prescribed.  Attended group and participated.  Routine safety checks maintained.  Patient is safe on and off the unit.

## 2022-11-06 NOTE — Progress Notes (Signed)
[facility] MD Progress Note  11/06/2022 3:57 PM Malori Myers  MRN:  [id number]  Subjective:   HPI: [place] Oborn is a 22 y.o. female with past psychiatric history of bipolar disorder, anxiety, depression, panic disorder, and tic disorder who presented to the [facility] on 04/24 with complaints of racing thoughts, insomnia, hyper-religious thoughts, and "mania" that reportedly worsened over 4-5 day span. While in ED patient endorsed auditory and visual hallucinations. Per chart review, pt presented to [facility] 04/23 with her parents for increased anxiety, insomnia, and bipolar I disorder after a reported break up with online boyfriend. Patient was not willing to engage with recommended treatment and parents denied any safety concerns; she was discharged home with parents.  Patient was transferred voluntarily on 04/23 to  [facility] for treatment and stabilization.  24 hour chart review: Staff report intermittently visible on milieu attending unit groups and activities where she has been observed attending to external/internal stimuli, issues with word finding. She was observed coming out of her room during the night where she was redirected to her room; staff report patient appeared to be responding at this time.    Assessment: Patient observed laying in bed where she is easily aroused, answers to name. She is oriented to person, month and year only. Reports reason for admission as 'too many emotions. My thoughts aren't real right now'. She sits up in bed, places hands in praying position and provides no direct eye contact with provider. Flat mood; bizarre affect. Paucity of speech. Scattered thoughts. She is either staring at walls, floor, or out of window but never tracks or makes direct eye contact with provider. She denies any SI/HI/AVH and was not observed talking to herself, however presentation is profoundly bizarre; stops multiple times during assessment in praying hands stance and stops talking. Thought blocking  noted. She endorses medication compliance and denies having any side effects. No EPS on my physical exam. She denied any substance use including marijuana, delta 8, to this provider preceding this admission.  Principal Problem: Schizophrenia spectrum disorder with psychotic disorder type not yet determined (HCC) Diagnosis: Principal Problem:   Schizophrenia spectrum disorder with psychotic disorder type not yet determined [facility]) Active Problems:   Insomnia   Anxiety state  Total Time spent with patient: 15 minutes  Past Psychiatric History:  Previous Psych Diagnoses: bipolar d/o Prior inpatient treatment: 01/09/2018 at [facility] Current/prior outpatient treatment: none  Prior rehab WU:JWJX  Psychotherapy BJ:YNWG  History of suicide attempt: Denies  History of homicide or aggression: Denies  Psychiatric medication history: AS per chart review, Lamictal, Zyprexa, Hydroxyzine  Psychiatric medication compliance history:non compliance  Neuromodulation history: none  Current Psychiatrist: Denies having one Current therapist: one   Past Medical History:  Past Medical History:  Diagnosis Date   Anxiety    Bipolar disorder (HCC)    Deliberate self-cutting    Depression    No past surgical history on file. Family History:  Family History  Problem Relation Age of Onset   Cancer Other    Diabetes Paternal Grandmother    Family Psychiatric  History: See H&P  Social History:  Social History   Substance and Sexual Activity  Alcohol Use Never   Alcohol/week: 0.0 standard drinks of alcohol     Social History   Substance and Sexual Activity  Drug Use Not Currently   Types: Solvent inhalants    Social History   Socioeconomic History   Marital status: Single    Spouse name: Not on file   Number  of children: 0   Years of education: Not on file   Highest education level: 10th grade  Occupational History   Occupation: Student  Tobacco Use   Smoking status: Never   Smokeless  tobacco: Never  Vaping Use   Vaping Use: Never used  Substance and Sexual Activity   Alcohol use: Never    Alcohol/week: 0.0 standard drinks of alcohol   Drug use: Not Currently    Types: Solvent inhalants   Sexual activity: Never  Other Topics Concern   Not on file  Social History Narrative   Pt is an 11th grader at [employer].  She lives with mother, father, and siblings.  Pt is followed by [facility] outpatient psychiatry for treatment of Bipolar I.   Social Determinants of Health   Financial Resource Strain: Not on file  Food Insecurity: Patient Declined (11/03/2022)   Hunger Vital Sign    Worried About Running Out of Food in the Last Year: Patient declined    Ran Out of Food in the Last Year: Patient declined  Transportation Needs: Patient Declined (11/03/2022)   PRAPARE - Administrator, Civil Service (Medical): Patient declined    Lack of Transportation (Non-Medical): Patient declined  Physical Activity: Not on file  Stress: Not on file  Social Connections: Not on file   Additional Social History:    Current Medications: Current Facility-Administered Medications  Medication Dose Route Frequency Provider Last Rate Last Admin   acetaminophen (TYLENOL) tablet 650 mg  650 mg Oral Q6H PRN Onuoha, Chinwendu V, NP       alum & mag hydroxide-simeth (MAALOX/MYLANTA) 200-200-20 MG/5ML suspension 30 mL  30 mL Oral Q4H PRN Onuoha, Chinwendu V, NP       haloperidol (HALDOL) tablet 5 mg  5 mg Oral TID PRN Phineas Inches, MD   5 mg at 11/05/22 2358   And   LORazepam (ATIVAN) tablet 2 mg  2 mg Oral TID PRN Phineas Inches, MD   2 mg at 11/05/22 2358   And   diphenhydrAMINE (BENADRYL) capsule 50 mg  50 mg Oral TID PRN Phineas Inches, MD   50 mg at 11/05/22 2358   haloperidol lactate (HALDOL) injection 5 mg  5 mg Intramuscular TID PRN Massengill, Harrold Donath, MD       And   LORazepam (ATIVAN) injection 2 mg  2 mg Intramuscular TID PRN Massengill, Harrold Donath, MD        And   diphenhydrAMINE (BENADRYL) injection 50 mg  50 mg Intramuscular TID PRN Massengill, Harrold Donath, MD       hydrOXYzine (ATARAX) tablet 25 mg  25 mg Oral TID PRN Onuoha, Chinwendu V, NP   25 mg at 11/05/22 2043   magnesium hydroxide (MILK OF MAGNESIA) suspension 30 mL  30 mL Oral Daily PRN Onuoha, Chinwendu V, NP       risperiDONE (RISPERDAL M-TABS) disintegrating tablet 1.5 mg  1.5 mg Oral Q12H Massengill, Nathan, MD   1.5 mg at 11/06/22 1610   traZODone (DESYREL) tablet 50 mg  50 mg Oral QHS PRN Onuoha, Chinwendu V, NP   50 mg at 11/05/22 2042    Lab Results:  Results for orders placed or performed during the hospital encounter of 11/03/22 (from the past 48 hour(s))  TSH     Status: None   Collection Time: 11/04/22  6:22 PM  Result Value Ref Range   TSH 0.662 0.350 - 4.500 uIU/mL    Comment: Performed by a 3rd Generation assay with  a functional sensitivity of <=0.01 uIU/mL. Performed at [facility], [address], [place], [place] [address]   HIV Antibody (routine testing w rflx)     Status: None   Collection Time: 11/04/22  6:22 PM  Result Value Ref Range   HIV Screen 4th Generation wRfx Non Reactive Non Reactive    Comment: Performed at [facility], [address], [place], [place] [address]  VITAMIN D 25 Hydroxy (Vit-D Deficiency, Fractures)     Status: None   Collection Time: 11/04/22  6:22 PM  Result Value Ref Range   Vit D, 25-Hydroxy 30.76 30 - 100 ng/mL    Comment: (NOTE) Vitamin D deficiency has been defined by the Institute of Medicine  and an Endocrine Society practice guideline as a level of serum 25-OH  vitamin D less than 20 ng/mL (1,2). The Endocrine Society went on to  further define vitamin D insufficiency as a level between 21 and 29  ng/mL (2).  1. IOM (Institute of Medicine). 2010. Dietary reference intakes for  calcium and D. [place]: [employer]. 2. Holick MF, Binkley [place], Bischoff-Ferrari HA, et al.  Evaluation,  treatment, and prevention of vitamin D deficiency: an Endocrine  Society clinical practice guideline, JCEM. 2011 Jul; 96(7): 1911-30.  Performed at [facility], [address], [place], [place] [address]   Lipid panel     Status: None   Collection Time: 11/05/22  6:34 PM  Result Value Ref Range   Cholesterol 118 0 - 200 mg/dL   Triglycerides 59 <469 mg/dL   HDL 49 >62 mg/dL   Total CHOL/HDL Ratio 2.4 RATIO   VLDL 12 0 - 40 mg/dL   LDL Cholesterol 57 0 - 99 mg/dL    Comment:        Total Cholesterol/HDL:CHD Risk Coronary Heart Disease Risk Table                     Men   Women  1/2 Average Risk   3.4   3.3  Average Risk       5.0   4.4  2 X Average Risk   9.6   7.1  3 X Average Risk  23.4   11.0        Use the calculated Patient Ratio above and the CHD Risk Table to determine the patient's CHD Risk.        ATP III CLASSIFICATION (LDL):  <100     mg/dL   Optimal  952-841  mg/dL   Near or Above                    Optimal  130-159  mg/dL   Borderline  324-401  mg/dL   High  >027     mg/dL   Very High Performed at [facility], [address] [place], [place] [address]   CBC with Differential/Platelet     Status: Abnormal   Collection Time: 11/06/22  6:28 AM  Result Value Ref Range   WBC 6.8 4.0 - 10.5 K/uL   RBC 4.00 3.87 - 5.11 MIL/uL   Hemoglobin 11.6 (L) 12.0 - 15.0 g/dL   HCT 44.0 (L) 34.7 - 42.5 %   MCV 89.3 80.0 - 100.0 fL   MCH 29.0 26.0 - 34.0 pg   MCHC 32.5 30.0 - 36.0 g/dL   RDW 95.6 38.7 - 56.4 %   Platelets 315 150 - 400 K/uL  nRBC 0.0 0.0 - 0.2 %   Neutrophils Relative % 44 %   Neutro Abs 3.0 1.7 - 7.7 K/uL   Lymphocytes Relative 42 %   Lymphs Abs 2.8 0.7 - 4.0 K/uL   Monocytes Relative 7 %   Monocytes Absolute 0.5 0.1 - 1.0 K/uL   Eosinophils Relative 6 %   Eosinophils Absolute 0.4 0.0 - 0.5 K/uL   Basophils Relative 1 %   Basophils Absolute 0.0 0.0 - 0.1 K/uL   Immature Granulocytes 0 %   Abs Immature  Granulocytes 0.02 0.00 - 0.07 K/uL    Comment: Performed at [facility], [address] [place], [place] [address]  Vitamin B12     Status: None   Collection Time: 11/06/22  6:28 AM  Result Value Ref Range   Vitamin B-12 242 180 - 914 pg/mL    Comment: (NOTE) This assay is not validated for testing neonatal or myeloproliferative syndrome specimens for Vitamin B12 levels. Performed at [facility], [address], [place], [place] [address]   Hemoglobin A1c     Status: None   Collection Time: 11/06/22  6:28 AM  Result Value Ref Range   Hgb A1c MFr Bld 4.8 4.8 - 5.6 %    Comment: (NOTE) Pre diabetes:          5.7%-6.4%  Diabetes:              >6.4%  Glycemic control for   <7.0% adults with diabetes    Mean Plasma Glucose 91.06 mg/dL    Comment: Performed at [facility], [address], [place], [place] [address]  Sedimentation rate     Status: None   Collection Time: 11/06/22  6:28 AM  Result Value Ref Range   Sed Rate 17 0 - 22 mm/hr    Comment: Performed at [facility], [address], [place], [place] [address]    Blood Alcohol level:  Lab Results  Component Value Date   ETH <10 11/01/2022   ETH <10 11/05/2019    Metabolic Disorder Labs: Lab Results  Component Value Date   HGBA1C 4.8 11/06/2022   MPG 91.06 11/06/2022   MPG 93.93 01/16/2018   Lab Results  Component Value Date   PROLACTIN 29.5 (H) 01/10/2018   Lab Results  Component Value Date   CHOL 118 11/05/2022   TRIG 59 11/05/2022   HDL 49 11/05/2022   CHOLHDL 2.4 11/05/2022   VLDL 12 11/05/2022   LDLCALC 57 11/05/2022   LDLCALC 55 01/16/2018    Physical Findings: AIMS: Facial and Oral Movements Muscles of Facial Expression: None, normal Lips and Perioral Area: None, normal Jaw: None, normal Tongue: None, normal,Extremity Movements Upper (arms, wrists, hands, fingers): None, normal Lower (legs, knees, ankles, toes): None,  normal, Trunk Movements Neck, shoulders, hips: None, normal, Overall Severity Severity of abnormal movements (highest score from questions above): None, normal Incapacitation due to abnormal movements: None, normal Patient's awareness of abnormal movements (rate only patient's report): No Awareness, Dental Status Current problems with teeth and/or dentures?: No Does patient usually wear dentures?: No  CIWA:    COWS:     Musculoskeletal: Strength & Muscle Tone: Lying in bed Gait & Station: Lying in bed Patient leans: Lying in bed  Psychiatric Specialty Exam:  Presentation  General Appearance:  Disheveled  Eye Contact: None  Speech: Garbled  Speech Volume: Decreased  Handedness: Right   Mood and Affect  Mood: Depressed  Affect: Non-Congruent;  Other (comment) (bizarre)   Thought Process  Thought Processes: Disorganized  Descriptions of Associations:Loose  Orientation:Partial  Thought Content:Illogical; Scattered  History of Schizophrenia/Schizoaffective disorder:No data recorded no Per collateral and medical record Duration of Psychotic Symptoms:No data recorded unclear, per collateral information Hallucinations:Hallucinations: Visual  Ideas of Reference:Delusions; Paranoia  Suicidal Thoughts:Suicidal Thoughts: No  Homicidal Thoughts:Homicidal Thoughts: No   Sensorium  Memory: Immediate Poor; Recent Poor  Judgment: Impaired  Insight: Poor   Executive Functions  Concentration: Poor  Attention Span: Poor  Recall: Poor  Fund of Knowledge: Fair  Language: Fair   Psychomotor Activity  Psychomotor Activity: Psychomotor Activity: Mannerisms   Assets  Assets: Resilience   Sleep  Sleep: Sleep: Fair    Physical Exam: Physical Exam Vitals reviewed.  Constitutional:      Appearance: She is normal weight.  Pulmonary:     Effort: Pulmonary effort is normal.  Neurological:     Mental Status: She is alert.     Motor: No  weakness.     Gait: Gait normal.  Psychiatric:        Attention and Perception: She is inattentive.        Mood and Affect: Affect is flat.        Behavior: Behavior is withdrawn. Behavior is cooperative.        Thought Content: Thought content is delusional. Thought content does not include homicidal or suicidal ideation. Thought content does not include homicidal or suicidal plan.        Judgment: Judgment is impulsive and inappropriate.    Review of Systems  Constitutional:  Negative for chills and fever.  Cardiovascular:  Negative for chest pain and palpitations.  Neurological:  Negative for dizziness, tingling, tremors and headaches.  Psychiatric/Behavioral:  Negative for depression, hallucinations, memory loss, substance abuse and suicidal ideas. The patient is nervous/anxious and has insomnia.   All other systems reviewed and are negative.  Blood pressure 122/73, pulse 87, temperature 99.2 F (37.3 C), temperature source Oral, resp. rate 20, height 5\' 9"  (1.753 m), weight 109.3 kg, last menstrual period 10/29/2022, SpO2 100 %. Body mass index is 35.59 kg/m.   Treatment Plan Summary: Daily contact with patient to assess and evaluate symptoms and progress in treatment and Medication management  ASSESSMENT:  Diagnoses / Active Problems: Brief psychotic disorder History of bipolar disorder   PLAN: Safety and Monitoring:  --  Voluntary admission to inpatient psychiatric unit for safety, stabilization and treatment  -- Daily contact with patient to assess and evaluate symptoms and progress in treatment  -- Patient's case to be discussed in multi-disciplinary team meeting  -- Observation Level : q15 minute checks  -- Vital signs:  q12 hours  -- Precautions: suicide, elopement, and assault  2. Psychiatric Diagnoses and Treatment:   Schizophrenia spectrum disorder with psychotic disorder type not yet determined  Increase: - Risperdal 1.5 mg BID; Goal for LAI. Ongoing  psychotic symptoms  --  The risks/benefits/side-effects/alternatives to this medication were discussed in detail with the patient and time was given for questions. The patient consents to medication trial.   -- Metabolic profile and EKG monitoring obtained while on an atypical antipsychotic (BMI: Lipid Panel: HbgA1c: QTc:)   -- Encouraged patient to participate in unit milieu and in scheduled group therapies    3. Medical Issues Being Addressed:   N/A  4. Discharge Planning:   -- Social work and case management to assist with discharge planning and identification of hospital follow-up needs prior to discharge  --  Estimated LOS: 5-7 days  -- Discharge Concerns: Need to establish a safety plan; Medication compliance and effectiveness  -- Discharge Goals: Return home with outpatient referrals for mental health follow-up including medication management/psychotherapy  Loletta Parish, NP 11/06/2022, 3:57 PM   Patient ID: Shirline Frees, female   DOB: 01-11-2001, 21 y.o.   MRN: [id number]

## 2022-11-07 DIAGNOSIS — F29 Unspecified psychosis not due to a substance or known physiological condition: Secondary | ICD-10-CM | POA: Diagnosis not present

## 2022-11-07 LAB — ANA W/REFLEX IF POSITIVE: Anti Nuclear Antibody (ANA): NEGATIVE

## 2022-11-07 MED ORDER — TRAZODONE HCL 100 MG PO TABS
100.0000 mg | ORAL_TABLET | Freq: Every evening | ORAL | Status: DC | PRN
  Administered 2022-11-07 – 2022-11-08 (×2): 100 mg via ORAL
  Filled 2022-11-07 (×2): qty 1

## 2022-11-07 NOTE — Progress Notes (Signed)
   11/06/22 2100  Psych Admission Type (Psych Patients Only)  Admission Status Voluntary  Psychosocial Assessment  Patient Complaints Anxiety  Eye Contact Brief  Facial Expression Anxious  Affect Anxious;Appropriate to circumstance  Speech Logical/coherent  Interaction Cautious  Motor Activity Fidgety;Restless  Appearance/Hygiene Unremarkable  Behavior Characteristics Cooperative;Appropriate to situation  Mood Pleasant  Thought Process  Coherency Circumstantial  Content Preoccupation  Delusions Paranoid  Perception Hallucinations  Hallucination Auditory  Judgment Impaired  Confusion None  Danger to Self  Current suicidal ideation? Denies  Danger to Others  Danger to Others None reported or observed

## 2022-11-07 NOTE — Progress Notes (Signed)
Pt denies SI, HI, AVH and pain. Anxious with multiple request of discharge "I want to go home. Can the doctor let me go now". Reports poor sleep last night "I wake up early in the morning and just move around in my room". Stated her appetite is good, denies depression and agitation. Noted with improved eye contact, spontaneous speech, tangential at times but is easily redirectable. Attended scheduled groups, engaged appropriately.  Reports her goal this shift "I want to listen to more music and color. It helps me a lot". Remains medication compliant, denies adverse drug reactions. Safety checks maintained at Q 15 minutes intervals without incident. Support, encouragement and reassurance offered this shift. Pt went off unit to cafeteria and courtyard with peers; returned without issues. Showered, change clothes this shift.

## 2022-11-07 NOTE — Group Note (Signed)
Date:  11/07/2022 Time:  10:32 PM  Group Topic/Focus:  Wrap-Up Group:   The focus of this group is to help patients review their daily goal of treatment and discuss progress on daily workbooks.    Participation Level:  Active  Participation Quality:  Appropriate  Affect:  Appropriate  Cognitive:  Appropriate  Insight: Appropriate  Engagement in Group:  Engaged  Modes of Intervention:  Education and Exploration  Additional Comments:  Patient attended and participated in group tonight. She reports that she like that she is creative, she knows how to keep herself calm and she love herself.  Kara Nguyen 11/07/2022, 10:32 PM

## 2022-11-07 NOTE — Progress Notes (Signed)
[facility] MD Progress Note  11/07/2022 8:43 AM Kara Nguyen  MRN:  [id number]  Subjective:   HPI: Kara Nguyen is a 22 y.o. female with past psychiatric history of bipolar disorder, anxiety, depression, panic disorder, and tic disorder who presented to the [facility] on 04/24 with complaints of racing thoughts, insomnia, hyper-religious thoughts, and "mania" that reportedly worsened over 4-5 day span. While in ED patient endorsed auditory and visual hallucinations. Per chart review, pt presented to [facility] 04/23 with her parents for increased anxiety, insomnia, and bipolar I disorder after a reported break up with online boyfriend. Patient was not willing to engage with recommended treatment and parents denied any safety concerns; she was discharged home with parents.  Patient was transferred voluntarily on 04/23 to  [facility] for treatment and stabilization.  24 hour chart review: Staff report intermittently visible on milieu attending unit groups and activities where she has been observed attending to external/internal stimuli in her room with bizarre behavior. Improved as shift progressed where she was noted to have increased visibility. Vital signs remain WNL. Patient slept 7.5 hours overnight.   Assessment: Patient observed laying in bed where she is easily aroused and answers. She sits up in bed where she is alert and oriented to person, place, time, and situation.She reports her reason for admission as 'I wasn't sleeping. Like 4 days. I thought I was God which I don't anymore. I had a lot of stuff in my had that I couldn't manage. My emotions, thoughts. You know how it is'. She then begins laughing inappropriately and becomes tearful. Her mood is moderately labile; withdrawn affect. No mannerisms or bizarre behaviors noted like day before. Thought content is scattered with some delusions but improved from day prior. She more visible outside of her room today and remains engaged through the entirety of the  assessment. She denies any illicit substance use.  When asked what triggered the thoughts of thinking she was God she replied, 'I felt something in my chest. I don't think it is real but it was at the time. My thoughts were all over the place' she then begins talking about a different subject and becomes tearful again. She reports 'feeling something was off' prior to admission then mentions a woman named 'Kara Nguyen having sex with my brother and dad. It's really weird'. She then states 'sometimes I think things are real that aren't. I stay in room along all day. No friends. I just draw and masturbate'. She denies any SI/HI/AVH. No Thought blocking noted. She endorses medication compliance and denies having any side effects. No EPS on my physical exam.   Collateral: Kara Nguyen (mother) [phone] (brother) (father) 37 minutes Mom reports father visited patient yesterday and feels she is about the same. Confirm patient report that she spends majority of time in her room. Deny any concern for illicit substance use. Father reports patient doesn't socialize a lot and gets really nervous around people; says patient is alone all of the time in her room and now that she is around other people is 'kind of scary'. 2-3 years ago was last time she was 'normal'.  1-2 month ago she decompensated; she wants a boyfriend. She will see a guy at grocery store and wonder why is not her friend and will go on Facebook to see him with a woman and she will get upset. Patient was homeschooled her final years of school 10-12th grades due to her inability to function patient gets nervous around other people and couldn't  handle attention. Says as a child patient was social and playing with other children, around 85  she wanted to be popular but noticed she began having increased difficulty making and keeping friends as starting to self isolate; not sure if anyone was bullying, social pressure her. Now she spends majority of her time  in her room with her headphones on and interaction occurs on internet; ;has one friend and family in [place] she speaks with. Dad thinks patient needs to socialize more outside of home; she doesn't know how to tend to ADLs age appropriately. Brother reports history of anxiety and depression. 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 Discussed current medication regimen and discussed LAI in which the family is in agreement with. Family inquiring about discharge. Mom to visit tonight.   Principal Problem: Schizophrenia spectrum disorder with psychotic disorder type not yet determined (HCC) Diagnosis: Principal Problem:   Schizophrenia spectrum disorder with psychotic disorder type not yet determined [facility]) Active Problems:   Insomnia   Anxiety state  Total Time spent with patient: 15 minutes  Past Psychiatric History:  Previous Psych Diagnoses: bipolar d/o Prior inpatient treatment: 01/09/2018 at [facility] Current/prior outpatient treatment: none  Prior rehab BJ:YNWG  Psychotherapy NF:AOZH  History of suicide attempt: Denies  History of homicide or aggression: Denies  Psychiatric medication history: AS per chart review, Lamictal, Zyprexa, Hydroxyzine  Psychiatric medication compliance history:non compliance  Neuromodulation history: none  Current Psychiatrist: Denies having one Current therapist: one   Past Medical History:  Past Medical History:  Diagnosis Date   Anxiety    Bipolar disorder (HCC)    Deliberate self-cutting    Depression    No past surgical history on file. Family History:  Family History  Problem Relation Age of Onset   Cancer Other    Diabetes Paternal Grandmother    Family Psychiatric  History: See H&P  Social History:  Social History   Substance and Sexual Activity  Alcohol Use Never   Alcohol/week: 0.0 standard drinks of alcohol     Social History   Substance and Sexual Activity  Drug Use Not Currently   Types: Solvent  inhalants    Social History   Socioeconomic History   Marital status: Single    Spouse name: Not on file   Number of children: 0   Years of education: Not on file   Highest education level: 10th grade  Occupational History   Occupation: Consulting civil engineer  Tobacco Use   Smoking status: Never   Smokeless tobacco: Never  Vaping Use   Vaping Use: Never used  Substance and Sexual Activity   Alcohol use: Never    Alcohol/week: 0.0 standard drinks of alcohol   Drug use: Not Currently    Types: Solvent inhalants   Sexual activity: Never  Other Topics Concern   Not on file  Social History Narrative   Pt is an 11th grader at [employer].  She lives with mother, father, and siblings.  Pt is followed by [facility] outpatient psychiatry for treatment of Bipolar I.   Social Determinants of Health   Financial Resource Strain: Not on file  Food Insecurity: Patient Declined (11/03/2022)   Hunger Vital Sign    Worried About Running Out of Food in the Last Year: Patient declined    Ran Out of Food in the Last Year: Patient declined  Transportation Needs: Patient Declined (11/03/2022)   PRAPARE - Administrator, Civil Service (Medical): Patient declined  Lack of Transportation (Non-Medical): Patient declined  Physical Activity: Not on file  Stress: Not on file  Social Connections: Not on file   Additional Social History:    Current Medications: Current Facility-Administered Medications  Medication Dose Route Frequency Provider Last Rate Last Admin   acetaminophen (TYLENOL) tablet 650 mg  650 mg Oral Q6H PRN Onuoha, Chinwendu V, NP       alum & mag hydroxide-simeth (MAALOX/MYLANTA) 200-200-20 MG/5ML suspension 30 mL  30 mL Oral Q4H PRN Onuoha, Chinwendu V, NP       haloperidol (HALDOL) tablet 5 mg  5 mg Oral TID PRN Phineas Inches, MD   5 mg at 11/05/22 2358   And   LORazepam (ATIVAN) tablet 2 mg  2 mg Oral TID PRN Phineas Inches, MD   2 mg at 11/05/22 2358   And    diphenhydrAMINE (BENADRYL) capsule 50 mg  50 mg Oral TID PRN Phineas Inches, MD   50 mg at 11/05/22 2358   haloperidol lactate (HALDOL) injection 5 mg  5 mg Intramuscular TID PRN Massengill, Harrold Donath, MD       And   LORazepam (ATIVAN) injection 2 mg  2 mg Intramuscular TID PRN Massengill, Harrold Donath, MD       And   diphenhydrAMINE (BENADRYL) injection 50 mg  50 mg Intramuscular TID PRN Massengill, Harrold Donath, MD       hydrOXYzine (ATARAX) tablet 25 mg  25 mg Oral TID PRN Onuoha, Chinwendu V, NP   25 mg at 11/06/22 2058   magnesium hydroxide (MILK OF MAGNESIA) suspension 30 mL  30 mL Oral Daily PRN Onuoha, Chinwendu V, NP       risperiDONE (RISPERDAL M-TABS) disintegrating tablet 1.5 mg  1.5 mg Oral Q12H Massengill, Nathan, MD   1.5 mg at 11/07/22 1610   traZODone (DESYREL) tablet 50 mg  50 mg Oral QHS PRN Onuoha, Chinwendu V, NP   50 mg at 11/06/22 2057    Lab Results:  Results for orders placed or performed during the hospital encounter of 11/03/22 (from the past 48 hour(s))  Lipid panel     Status: None   Collection Time: 11/05/22  6:34 PM  Result Value Ref Range   Cholesterol 118 0 - 200 mg/dL   Triglycerides 59 <960 mg/dL   HDL 49 >45 mg/dL   Total CHOL/HDL Ratio 2.4 RATIO   VLDL 12 0 - 40 mg/dL   LDL Cholesterol 57 0 - 99 mg/dL    Comment:        Total Cholesterol/HDL:CHD Risk Coronary Heart Disease Risk Table                     Men   Women  1/2 Average Risk   3.4   3.3  Average Risk       5.0   4.4  2 X Average Risk   9.6   7.1  3 X Average Risk  23.4   11.0        Use the calculated Patient Ratio above and the CHD Risk Table to determine the patient's CHD Risk.        ATP III CLASSIFICATION (LDL):  <100     mg/dL   Optimal  409-811  mg/dL   Near or Above                    Optimal  130-159  mg/dL   Borderline  914-782  mg/dL   High  >956  mg/dL   Very High Performed at [facility], [address], [place], [place] [address]   CBC with  Differential/Platelet     Status: Abnormal   Collection Time: 11/06/22  6:28 AM  Result Value Ref Range   WBC 6.8 4.0 - 10.5 K/uL   RBC 4.00 3.87 - 5.11 MIL/uL   Hemoglobin 11.6 (L) 12.0 - 15.0 g/dL   HCT 57.8 (L) 46.9 - 62.9 %   MCV 89.3 80.0 - 100.0 fL   MCH 29.0 26.0 - 34.0 pg   MCHC 32.5 30.0 - 36.0 g/dL   RDW 52.8 41.3 - 24.4 %   Platelets 315 150 - 400 K/uL   nRBC 0.0 0.0 - 0.2 %   Neutrophils Relative % 44 %   Neutro Abs 3.0 1.7 - 7.7 K/uL   Lymphocytes Relative 42 %   Lymphs Abs 2.8 0.7 - 4.0 K/uL   Monocytes Relative 7 %   Monocytes Absolute 0.5 0.1 - 1.0 K/uL   Eosinophils Relative 6 %   Eosinophils Absolute 0.4 0.0 - 0.5 K/uL   Basophils Relative 1 %   Basophils Absolute 0.0 0.0 - 0.1 K/uL   Immature Granulocytes 0 %   Abs Immature Granulocytes 0.02 0.00 - 0.07 K/uL    Comment: Performed at [facility], [address], [place], [place] [address]  Vitamin B12     Status: None   Collection Time: 11/06/22  6:28 AM  Result Value Ref Range   Vitamin B-12 242 180 - 914 pg/mL    Comment: (NOTE) This assay is not validated for testing neonatal or myeloproliferative syndrome specimens for Vitamin B12 levels. Performed at [facility], [address], [place], [place] [address]   Hemoglobin A1c     Status: None   Collection Time: 11/06/22  6:28 AM  Result Value Ref Range   Hgb A1c MFr Bld 4.8 4.8 - 5.6 %    Comment: (NOTE) Pre diabetes:          5.7%-6.4%  Diabetes:              >6.4%  Glycemic control for   <7.0% adults with diabetes    Mean Plasma Glucose 91.06 mg/dL    Comment: Performed at [facility], [address] [place], [place] [address]  Sedimentation rate     Status: None   Collection Time: 11/06/22  6:28 AM  Result Value Ref Range   Sed Rate 17 0 - 22 mm/hr    Comment: Performed at [facility], [address], [place], [place] [address]    Blood Alcohol level:  Lab Results   Component Value Date   ETH <10 11/01/2022   ETH <10 11/05/2019    Metabolic Disorder Labs: Lab Results  Component Value Date   HGBA1C 4.8 11/06/2022   MPG 91.06 11/06/2022   MPG 93.93 01/16/2018   Lab Results  Component Value Date   PROLACTIN 29.5 (H) 01/10/2018   Lab Results  Component Value Date   CHOL 118 11/05/2022   TRIG 59 11/05/2022   HDL 49 11/05/2022   CHOLHDL 2.4 11/05/2022   VLDL 12 11/05/2022   LDLCALC 57 11/05/2022   LDLCALC 55 01/16/2018    Physical Findings: AIMS: Facial and Oral Movements Muscles of Facial Expression: None, normal Lips and Perioral Area: None, normal Jaw: None, normal Tongue: None, normal,Extremity Movements Upper (arms, wrists, hands, fingers): None, normal Nguyen (legs, knees, ankles, toes): None, normal,  Trunk Movements Neck, shoulders, hips: None, normal, Overall Severity Severity of abnormal movements (highest score from questions above): None, normal Incapacitation due to abnormal movements: None, normal Patient's awareness of abnormal movements (rate only patient's report): No Awareness, Dental Status Current problems with teeth and/or dentures?: No Does patient usually wear dentures?: No  CIWA:    COWS:     Musculoskeletal: Strength & Muscle Tone: Lying in bed Gait & Station: Lying in bed Patient leans: Lying in bed  Psychiatric Specialty Exam:  Presentation  General Appearance:  Disheveled  Eye Contact: None  Speech: Garbled  Speech Volume: Decreased  Handedness: Right   Mood and Affect  Mood: Depressed  Affect: Non-Congruent; Other (comment) (bizarre)   Thought Process  Thought Processes: Disorganized  Descriptions of Associations:Loose  Orientation:Partial  Thought Content:Illogical; Scattered  History of Schizophrenia/Schizoaffective disorder:No data recorded no Per collateral and medical record Duration of Psychotic Symptoms:No data recorded unclear, per collateral  information Hallucinations:Hallucinations: Visual  Ideas of Reference:Delusions; Paranoia  Suicidal Thoughts:Suicidal Thoughts: No  Homicidal Thoughts:Homicidal Thoughts: No   Sensorium  Memory: Immediate Poor; Recent Poor  Judgment: Impaired  Insight: Poor   Executive Functions  Concentration: Poor  Attention Span: Poor  Recall: Poor  Fund of Knowledge: Fair  Language: Fair   Psychomotor Activity  Psychomotor Activity: Psychomotor Activity: Mannerisms   Assets  Assets: Resilience   Sleep  Sleep: Sleep: Fair    Physical Exam: Physical Exam Vitals reviewed.  Constitutional:      Appearance: She is normal weight.  Pulmonary:     Effort: Pulmonary effort is normal.  Neurological:     Mental Status: She is alert.     Motor: No weakness.     Gait: Gait normal.  Psychiatric:        Attention and Perception: Attention normal.        Mood and Affect: Affect is tearful and inappropriate.        Speech: Speech is tangential.        Behavior: Behavior is cooperative.        Thought Content: Thought content is delusional. Thought content does not include homicidal or suicidal ideation. Thought content does not include homicidal or suicidal plan.        Judgment: Judgment is impulsive and inappropriate.    Review of Systems  Constitutional:  Negative for chills and fever.  Cardiovascular:  Negative for chest pain and palpitations.  Neurological:  Negative for dizziness, tingling, tremors and headaches.  Psychiatric/Behavioral:  Negative for depression, hallucinations, memory loss, substance abuse and suicidal ideas. The patient is nervous/anxious and has insomnia.   All other systems reviewed and are negative.  Blood pressure 129/69, pulse (!) 102, temperature 97.8 F (36.6 C), temperature source Oral, resp. rate 16, height 5\' 9"  (1.753 m), weight 109.3 kg, last menstrual period 10/29/2022, SpO2 100 %. Body mass index is 35.59 kg/m.   Treatment  Plan Summary: Daily contact with patient to assess and evaluate symptoms and progress in treatment and Medication management  ASSESSMENT:  Diagnoses / Active Problems: Brief psychotic disorder History of bipolar disorder   PLAN: Safety and Monitoring:  --  Voluntary admission to inpatient psychiatric unit for safety, stabilization and  treatment  -- Daily contact with patient to assess and evaluate symptoms and progress in  treatment  -- Patient's case to be discussed in multi-disciplinary team meeting  -- Observation Level : q15 minute checks  -- Vital signs:  q12 hours  -- Precautions: suicide, elopement, and assault  2. Psychiatric Diagnoses and Treatment:   Schizophrenia spectrum disorder with psychotic disorder type not yet determined  Continue: - Risperdal 1.5 mg BID; Goal for LAI. Ongoing psychotic symptoms  --  The risks/benefits/side-effects/alternatives to this medication were discussed in detail with the patient and time was given for questions. The patient consents to medication trial.   -- Metabolic profile and EKG monitoring obtained while on an atypical antipsychotic (BMI: Lipid Panel: HbgA1c: QTc:)   -- Encouraged patient to participate in unit milieu and in scheduled group therapies    3. Medical Issues Being Addressed:   N/A  4. Discharge Planning:   -- Social work and case management to assist with discharge planning and identification of hospital follow-up needs prior to discharge  -- Estimated LOS: 5-7 days  -- Discharge Concerns: Need to establish a safety plan; Medication compliance and effectiveness  -- Discharge Goals: Return home with outpatient referrals for mental health follow-up including medication management/psychotherapy  Loletta Parish, NP 11/07/2022, 8:43 AM   Patient ID: Kara Nguyen, female   DOB: [birth year], 21 y.o.   MRN: [id number]

## 2022-11-07 NOTE — Progress Notes (Signed)
   11/07/22 0545  15 Minute Checks  Location Bedroom  Visual Appearance Calm  Behavior Composed  Sleep (Behavioral Health Patients Only)  Calculate sleep? (Click Yes once per 24 hr at 0600 safety check) Yes  Documented sleep last 24 hours 7.5

## 2022-11-08 DIAGNOSIS — F29 Unspecified psychosis not due to a substance or known physiological condition: Secondary | ICD-10-CM | POA: Diagnosis not present

## 2022-11-08 LAB — CERULOPLASMIN: Ceruloplasmin: 33.3 mg/dL (ref 19.0–39.0)

## 2022-11-08 MED ORDER — RISPERIDONE 0.5 MG PO TABS
1.5000 mg | ORAL_TABLET | Freq: Every day | ORAL | Status: DC
  Administered 2022-11-08: 1.5 mg via ORAL
  Filled 2022-11-08 (×4): qty 3

## 2022-11-08 NOTE — Progress Notes (Signed)
    11/07/22 2100  Psych Admission Type (Psych Patients Only)  Admission Status Voluntary  Psychosocial Assessment  Patient Complaints Anxiety  Eye Contact Brief  Facial Expression Anxious  Affect Appropriate to circumstance  Speech Logical/coherent  Interaction Cautious  Motor Activity Fidgety;Restless  Appearance/Hygiene Unremarkable  Behavior Characteristics Cooperative  Mood Preoccupied;Anxious  Thought Process  Coherency Circumstantial  Content Preoccupation  Delusions Paranoid  Perception Hallucinations  Hallucination Auditory  Judgment Impaired  Confusion None  Danger to Self  Current suicidal ideation? Denies  Danger to Others  Danger to Others None reported or observed

## 2022-11-08 NOTE — Progress Notes (Signed)
   11/08/22 2100  Psych Admission Type (Psych Patients Only)  Admission Status Voluntary  Psychosocial Assessment  Patient Complaints Anxiety  Eye Contact Brief  Facial Expression Anxious  Affect Appropriate to circumstance  Speech Logical/coherent  Interaction Cautious  Motor Activity Fidgety;Restless  Appearance/Hygiene Unremarkable  Behavior Characteristics Cooperative  Mood Anxious;Suspicious  Aggressive Behavior  Effect No apparent injury  Thought Process  Coherency Circumstantial  Content Preoccupation  Delusions Paranoid  Perception Hallucinations  Hallucination Auditory  Judgment Impaired  Confusion None  Danger to Self  Current suicidal ideation? Denies  Danger to Others  Danger to Others None reported or observed

## 2022-11-08 NOTE — Plan of Care (Signed)
  Problem: Education: Goal: Emotional status will improve Outcome: Progressing Goal: Mental status will improve Outcome: Progressing   Problem: Coping: Goal: Ability to demonstrate self-control will improve Outcome: Progressing   Problem: Nutritional: Goal: Ability to achieve adequate nutritional intake will improve Outcome: Progressing

## 2022-11-08 NOTE — [facility] Group Notes (Signed)
[facility] Group Notes:  (Nursing/MHT/Case Management/Adjunct)  Date:  11/08/2022  Time:  10:59 PM  Type of Therapy:  Psychoeducational Skills  Participation Level:  Active  Participation Quality:  Appropriate  Affect:  Appropriate  Cognitive:  Alert and Appropriate  Insight:  Appropriate and Good  Engagement in Group:  Engaged  Modes of Intervention:  Discussion  Summary of Progress/Problems:  Adelina Mings 11/08/2022, 10:59 PM

## 2022-11-08 NOTE — Progress Notes (Signed)
Patient presents with anxiety and complaints of not getting enough sleep.  Administered PRN Hydroxyzine and Trazodone per [facility] per patient request.

## 2022-11-08 NOTE — Progress Notes (Signed)
   11/08/22 0900  Psych Admission Type (Psych Patients Only)  Admission Status Voluntary  Psychosocial Assessment  Patient Complaints Anxiety  Eye Contact Brief  Facial Expression Anxious  Affect Appropriate to circumstance  Speech Logical/coherent  Interaction Cautious  Motor Activity Fidgety;Restless  Appearance/Hygiene Unremarkable  Behavior Characteristics Cooperative  Mood Preoccupied;Anxious  Thought Process  Coherency Circumstantial  Content Preoccupation  Delusions Paranoid  Perception Hallucinations  Hallucination Auditory  Judgment Impaired  Confusion None  Danger to Self  Current suicidal ideation? Denies  Danger to Others  Danger to Others None reported or observed

## 2022-11-08 NOTE — Progress Notes (Signed)
[facility] MD Progress Note  11/08/2022 2:51 PM Kara Nguyen  MRN:  [id number] Principal Problem: Schizophrenia spectrum disorder with psychotic disorder type not yet determined (HCC) Diagnosis: Principal Problem:   Schizophrenia spectrum disorder with psychotic disorder type not yet determined [facility]) Active Problems:   Insomnia   Anxiety state  Reason For Admission: Kara Nguyen is a 22 y.o. female  who presented to the [facility] on 04/24 with complaints of racing thoughts, insomnia, hyper religiosity, and "mania". Pt previously diagnoses with bipolar d/o, anxiety & depression.  Patient was transferred voluntarily on 02/23 to this [facility] for treatment and stabilization of her mental status.   24 hr chart review: Sleep Hours last night: 7.25 hrs  Nursing Concerns: None reported  Behavioral episodes in the past 24 hrs: None  Medication Compliance: Compliant  Vital Signs in the past 24 hrs: Slight elevation in HR of 112 earlier today morning. A review of flow sheets show that this has not been persistent. PRN Medications in the past 24 hrs: Trazodone & Hydroxyzine    Patient Assessment note: Today, speech is coherent and organized, she is logical, speech ir organized and she is responding appropriately to questions being asked. She denies SI/HI/AVH, denies paranoia and there is no evidence of delusional thinking. She is not guarded as compared to time admission and the few days following admission to [facility]. She reports a good sleep quality last night, reports a good appetite, denies medication related side effects, denies being in any physical distress. No TD/EPS type symptoms found on assessment, and pt denies any feelings of stiffness. AIMS: 0.   Pt is verbalizing readiness for discharge, and as per objective assessment, mood and thoughts have improved significantly since admission. Pt educated that we would need to make an appointment for f/u on the outpatient prior to discharging her. Pt educated  that safety planning with her parents would also need to be completed, and verbalizes tomorrow. Plan is for CSW to complete the above tomorrow since outpatient practices are closed over the weekends.   We discussed LAI medication options today with pt, but she is non receptive at this time, states that she will reach out to her parents and ask for their opinion prior to making a decision. We will change Risperdal disintegrating tabs to regular Risperdal pills in anticipation of discharge as the disintegrating may be more costly for pt. Continuing other medications as listed below. Assigned RN asked to recheck HR. Hydration encouraged.  Total Time spent with patient: 45 minutes  Past Psychiatric History: See H & P  Past Medical History:  Past Medical History:  Diagnosis Date   Anxiety    Bipolar disorder (HCC)    Deliberate self-cutting    Depression    No past surgical history on file. Family History:  Family History  Problem Relation Age of Onset   Cancer Other    Diabetes Paternal Grandmother    Family Psychiatric  History: See H & P Social History:  Social History   Substance and Sexual Activity  Alcohol Use Never   Alcohol/week: 0.0 standard drinks of alcohol     Social History   Substance and Sexual Activity  Drug Use Not Currently   Types: Solvent inhalants    Social History   Socioeconomic History   Marital status: Single    Spouse name: Not on file   Number of children: 0   Years of education: Not on file   Highest education level: 10th grade  Occupational History  Occupation: Consulting civil engineer  Tobacco Use   Smoking status: Never   Smokeless tobacco: Never  Vaping Use   Vaping Use: Never used  Substance and Sexual Activity   Alcohol use: Never    Alcohol/week: 0.0 standard drinks of alcohol   Drug use: Not Currently    Types: Solvent inhalants   Sexual activity: Never  Other Topics Concern   Not on file  Social History Narrative   Pt is an 11th grader at  [employer].  She lives with mother, father, and siblings.  Pt is followed by [facility] outpatient psychiatry for treatment of Bipolar I.   Social Determinants of Health   Financial Resource Strain: Not on file  Food Insecurity: Patient Declined (11/03/2022)   Hunger Vital Sign    Worried About Running Out of Food in the Last Year: Patient declined    Ran Out of Food in the Last Year: Patient declined  Transportation Needs: Patient Declined (11/03/2022)   PRAPARE - Administrator, Civil Service (Medical): Patient declined    Lack of Transportation (Non-Medical): Patient declined  Physical Activity: Not on file  Stress: Not on file  Social Connections: Not on file   Sleep: Good  Appetite:  Good  Current Medications: Current Facility-Administered Medications  Medication Dose Route Frequency Provider Last Rate Last Admin   acetaminophen (TYLENOL) tablet 650 mg  650 mg Oral Q6H PRN Onuoha, Chinwendu V, NP       alum & mag hydroxide-simeth (MAALOX/MYLANTA) 200-200-20 MG/5ML suspension 30 mL  30 mL Oral Q4H PRN Onuoha, Chinwendu V, NP       haloperidol (HALDOL) tablet 5 mg  5 mg Oral TID PRN Phineas Inches, MD   5 mg at 11/05/22 2358   And   LORazepam (ATIVAN) tablet 2 mg  2 mg Oral TID PRN Phineas Inches, MD   2 mg at 11/05/22 2358   And   diphenhydrAMINE (BENADRYL) capsule 50 mg  50 mg Oral TID PRN Phineas Inches, MD   50 mg at 11/05/22 2358   haloperidol lactate (HALDOL) injection 5 mg  5 mg Intramuscular TID PRN Massengill, Harrold Donath, MD       And   LORazepam (ATIVAN) injection 2 mg  2 mg Intramuscular TID PRN Massengill, Harrold Donath, MD       And   diphenhydrAMINE (BENADRYL) injection 50 mg  50 mg Intramuscular TID PRN Massengill, Harrold Donath, MD       hydrOXYzine (ATARAX) tablet 25 mg  25 mg Oral TID PRN Onuoha, Chinwendu V, NP   25 mg at 11/07/22 2101   magnesium hydroxide (MILK OF MAGNESIA) suspension 30 mL  30 mL Oral Daily PRN Onuoha, Chinwendu V, NP        risperiDONE (RISPERDAL) tablet 1.5 mg  1.5 mg Oral QHS Divit Stipp, NP       traZODone (DESYREL) tablet 100 mg  100 mg Oral QHS PRN Rex Kras, MD   100 mg at 11/07/22 2101    Lab Results: No results found for this or any previous visit (from the past 48 hour(s)).  Blood Alcohol level:  Lab Results  Component Value Date   [facility] <10 11/01/2022   ETH <10 11/05/2019    Metabolic Disorder Labs: Lab Results  Component Value Date   HGBA1C 4.8 11/06/2022   MPG 91.06 11/06/2022   MPG 93.93 01/16/2018   Lab Results  Component Value Date   PROLACTIN 29.5 (H) 01/10/2018   Lab Results  Component Value Date  CHOL 118 11/05/2022   TRIG 59 11/05/2022   HDL 49 11/05/2022   CHOLHDL 2.4 11/05/2022   VLDL 12 11/05/2022   LDLCALC 57 11/05/2022   LDLCALC 55 01/16/2018    Physical Findings: AIMS: Facial and Oral Movements Muscles of Facial Expression: None, normal Lips and Perioral Area: None, normal Jaw: None, normal Tongue: None, normal,Extremity Movements Upper (arms, wrists, hands, fingers): None, normal Lower (legs, knees, ankles, toes): None, normal, Trunk Movements Neck, shoulders, hips: None, normal, Overall Severity Severity of abnormal movements (highest score from questions above): None, normal Incapacitation due to abnormal movements: None, normal Patient's awareness of abnormal movements (rate only patient's report): No Awareness, Dental Status Current problems with teeth and/or dentures?: No Does patient usually wear dentures?: No  CIWA:    COWS:     Musculoskeletal: Strength & Muscle Tone: within normal limits Gait & Station: normal Patient leans: N/A  Psychiatric Specialty Exam:  Presentation  General Appearance:  Appropriate for Environment; Fairly Groomed  Eye Contact: Good  Speech: Clear and Coherent  Speech Volume: Normal  Handedness: Right   Mood and Affect  Mood: Euthymic  Affect: Appropriate; Congruent   Thought Process   Thought Processes: Coherent  Descriptions of Associations:Intact  Orientation:Full (Time, Place and Person)  Thought Content:Logical  History of Schizophrenia/Schizoaffective disorder:No data recorded Duration of Psychotic Symptoms:No data recorded Hallucinations:Hallucinations: None  Ideas of Reference:None  Suicidal Thoughts:Suicidal Thoughts: No  Homicidal Thoughts:Homicidal Thoughts: No   Sensorium  Memory: Immediate Good  Judgment: Fair  Insight: Fair   Executive Functions  Concentration: Good  Attention Span: Good  Recall: Good  Fund of Knowledge: Good  Language: Good   Psychomotor Activity  Psychomotor Activity: Psychomotor Activity: Normal   Assets  Assets: Communication Skills; Resilience; Social Support   Sleep  Sleep: Sleep: Good    Physical Exam: Physical Exam Constitutional:      Appearance: Normal appearance.  HENT:     Nose: No congestion or rhinorrhea.  Eyes:     Pupils: Pupils are equal, round, and reactive to light.  Pulmonary:     Effort: Pulmonary effort is normal.  Musculoskeletal:        General: Normal range of motion.     Cervical back: Normal range of motion.  Neurological:     Mental Status: She is alert and oriented to person, place, and time.  Psychiatric:        Thought Content: Thought content normal.    Review of Systems  Constitutional:  Negative for fever.  HENT:  Negative for hearing loss.   Eyes:  Negative for blurred vision.  Respiratory:  Negative for cough.   Cardiovascular:  Negative for chest pain.  Gastrointestinal:  Negative for heartburn.  Genitourinary:  Negative for dysuria.  Musculoskeletal:  Negative for myalgias.  Skin:  Negative for rash.  Neurological:  Negative for dizziness.  Psychiatric/Behavioral:  Positive for depression. Negative for hallucinations, memory loss, substance abuse and suicidal ideas. The patient is nervous/anxious and has insomnia.    Blood pressure  (!) 132/97, pulse (!) 112, temperature 98.2 F (36.8 C), temperature source Oral, resp. rate 18, height 5\' 9"  (1.753 m), weight 109.3 kg, last menstrual period 10/29/2022, SpO2 100 %. Body mass index is 35.59 kg/m.  Treatment Plan Summary: Daily contact with patient to assess and evaluate symptoms and progress in treatment and Medication management   Observation Level/Precautions:  15 minute checks  Laboratory:  Labs reviewed :First time psychosis work up [employer] see Labs ordered  Psychotherapy:  Unit Group sessions  Medications:  See [facility]  Consultations:  To be determined   Discharge Concerns:  Safety, medication compliance, mood stability  Estimated LOS: 5-7 days  Other:  N/A      PLAN Safety and Monitoring: Voluntary admission to inpatient psychiatric unit for safety, stabilization and treatment Daily contact with patient to assess and evaluate symptoms and progress in treatment Patient's case to be discussed in multi-disciplinary team meeting Observation Level : q15 minute checks Vital signs: q12 hours Precautions: Safety    Long Term Goal(s): Improvement in symptoms so as ready for discharge   Short Term Goals: Ability to identify changes in lifestyle to reduce recurrence of condition will improve, Ability to demonstrate self-control will improve, Ability to identify and develop effective coping behaviors will improve, and Compliance with prescribed medications will improve   Diagnoses  Principal Problem:   Schizophrenia spectrum disorder with psychotic disorder type not yet determined Active Problems:   Insomnia   Anxiety state   Medications -Continue Risperdal 1.5 mg BID for psychosis -Switch from M-tabs to regular Risperdal tablets -Continue Trazodone 100 mg nightly PRN for sleep -Continue Hydroxyzine 25 mg TID PRN for anxiety -Agitation Protocol: Start Benadryl/Ativan/Haldol PRN-See MAR for complete order   Other PRNS -Continue Tylenol 650 mg every 6 hours  PRN for mild pain -Continue Maalox 30 mg every 4 hrs PRN for indigestion -Continue Milk of Magnesia as needed every 6 hrs for constipation   Discharge Planning: Social work and case management to assist with discharge planning and identification of hospital follow-up needs prior to discharge Estimated LOS: 5-7 days Discharge Concerns: Need to establish a safety plan; Medication compliance and effectiveness Discharge Goals: Return home with outpatient referrals for mental health follow-up including medication management/psychotherapy   I certify that inpatient services furnished can reasonably be expected to improve the patient's condition.      Starleen Blue, NP 11/08/2022, 2:51 PM

## 2022-11-08 NOTE — Progress Notes (Signed)
   11/08/22 1610  15 Minute Checks  Location Bedroom  Visual Appearance Calm  Behavior Sleeping  Sleep (Behavioral Health Patients Only)  Calculate sleep? (Click Yes once per 24 hr at 0600 safety check) Yes  Documented sleep last 24 hours 7.25

## 2022-11-09 DIAGNOSIS — F29 Unspecified psychosis not due to a substance or known physiological condition: Secondary | ICD-10-CM | POA: Diagnosis not present

## 2022-11-09 LAB — N-METHYL-D-ASPARTATE RECPT.IGG: N-methyl-D-Aspartate Recpt.IgG: NEGATIVE

## 2022-11-09 MED ORDER — TRAZODONE HCL 100 MG PO TABS: 100.0000 mg | ORAL_TABLET | Freq: Every evening | ORAL | 0 refills | Status: DC | PRN

## 2022-11-09 MED ORDER — HYDROXYZINE HCL 25 MG PO TABS: 25.0000 mg | ORAL_TABLET | Freq: Three times a day (TID) | ORAL | 0 refills | Status: DC | PRN

## 2022-11-09 MED ORDER — RISPERIDONE 0.5 MG PO TABS: 1.5000 mg | ORAL_TABLET | Freq: Every day | ORAL | 0 refills | Status: DC

## 2022-11-09 NOTE — BHH Suicide Risk Assessment (Signed)
Suicide Risk Assessment  Discharge Assessment    [facility] Discharge Suicide Risk Assessment   Principal Problem: Schizophrenia spectrum disorder with psychotic disorder type not yet determined [facility]) Discharge Diagnoses: Principal Problem:   Schizophrenia spectrum disorder with psychotic disorder type not yet determined [facility]) Active Problems:   Insomnia   Anxiety state  Reason For Admission: Kara Nguyen is a 22 y.o. female  who presented to the [facility] on 04/24 with complaints of racing thoughts, insomnia, hyper religiosity, and "mania". Pt previously diagnoses with bipolar d/o, anxiety & depression.  Patient was transferred voluntarily on 02/23 to this [facility] for treatment and stabilization of her mental status.   Hospital Course: During the patient's hospitalization, patient had extensive initial psychiatric evaluation, and follow-up psychiatric evaluations every day. Psychiatric diagnoses provided upon initial assessment are as listed above. Patient's psychiatric medications were adjusted on admission as follows: -Started Risperdal 1 mg BID for psychosis  -Continued Trazodone 50 mg nightly PRN for sleep -Continued Hydroxyzine 25 mg TID PRN for anxiety -Agitation Protocol: Start Benadryl/Ativan/Haldol PRN  During the hospitalization, other adjustments were made to the patient's psychiatric medication regimen. Medications at discharge are as follows: -Continue Risperdal 1.5 mg nightly for psychosis/stabilization -Continue Trazodone 100 mg nightly PRN for sleep -Continue Hydroxyzine 25 mg TID PRN for anxiety  Patient's care was discussed during the interdisciplinary team meeting every day during the hospitalization. The patient denies having side effects to prescribed psychiatric medication. Gradually, patient started adjusting to milieu. The patient was evaluated each day by a clinical provider to ascertain response to treatment. Improvement was noted by the patient's report of decreasing  symptoms, improved sleep and appetite, affect, medication tolerance, behavior, and participation in unit programming.  Patient was asked each day to complete a self inventory noting mood, mental status, pain, new symptoms, anxiety and concerns.    Symptoms were reported as significantly decreased or resolved completely by discharge. On day of discharge, the patient reports that their mood is stable. The patient denied having suicidal thoughts for more than 48 hours prior to discharge.  Patient denies having homicidal thoughts.  Patient denies having auditory hallucinations.  Patient denies any visual hallucinations or other symptoms of psychosis. The patient was motivated to continue taking medication with a goal of continued improvement in mental health.   The patient reports their target psychiatric symptoms of depression, psychosis & insomnia responded well to the psychiatric medications, and the patient reports overall benefit from this psychiatric hospitalization. Supportive psychotherapy was provided to the patient. The patient also participated in regular group therapy while hospitalized. Coping skills, problem solving as well as relaxation therapies were also part of the unit programming.  Labs were reviewed with the patient, and abnormal results were discussed with the patient. During this hospitalization, pt had extensive work up, to rule out medical reasons for her psychosis. First time psychosis labs were completed. All were negative with the exception of N-methyl-D-Aspartate Recpt.IgG and heavy metal which are still in process. Pt educated to follow this up with her PCP.   The patient is able to verbalize their individual safety plan to this provider.  # It is recommended to the patient to continue psychiatric medications as prescribed, after discharge from the hospital.    # It is recommended to the patient to follow up with your outpatient psychiatric provider and PCP.  # It was  discussed with the patient, the impact of alcohol, drugs, tobacco have been there overall psychiatric and medical wellbeing, and total abstinence from  substance use was recommended the patient.ed.  # Prescriptions provided or sent directly to preferred pharmacy at discharge. Patient agreeable to plan. Given opportunity to ask questions. Appears to feel comfortable with discharge.    # In the event of worsening symptoms, the patient is instructed to call the crisis hotline (988), 911 and or go to the nearest ED for appropriate evaluation and treatment of symptoms. To follow-up with primary care provider for other medical issues, concerns and or health care needs  # Patient was discharged home to her parents' home with a plan to follow up as noted below.   Total Time spent with patient: 45 minutes  Musculoskeletal: Strength & Muscle Tone: within normal limits Gait & Station: normal Patient leans: N/A  Psychiatric Specialty Exam  Presentation  General Appearance:  Appropriate for Environment; Fairly Groomed  Eye Contact: Good  Speech: Clear and Coherent  Speech Volume: Normal  Handedness: Right   Mood and Affect  Mood: Euthymic  Duration of Depression Symptoms: No data recorded Affect: Appropriate; Congruent   Thought Process  Thought Processes: Coherent  Descriptions of Associations:Intact  Orientation:Full (Time, Place and Person)  Thought Content:Logical  History of Schizophrenia/Schizoaffective disorder:Yes  Duration of Psychotic Symptoms:Greater than six months  Hallucinations:Hallucinations: None  Ideas of Reference:None  Suicidal Thoughts:Suicidal Thoughts: No  Homicidal Thoughts:Homicidal Thoughts: No   Sensorium  Memory: Immediate Good  Judgment: Fair  Insight: Fair   Art therapist  Concentration: Fair  Attention Span: Good  Recall: Good  Fund of Knowledge: Good  Language: Good  Psychomotor Activity  Psychomotor  Activity: Psychomotor Activity: Normal  Assets  Assets: Communication Skills; Social Support; Resilience  Sleep  Sleep: Sleep: Good  Physical Exam: Physical Exam Constitutional:      Appearance: Normal appearance.  HENT:     Nose: No congestion or rhinorrhea.  Eyes:     Pupils: Pupils are equal, round, and reactive to light.  Musculoskeletal:        General: Normal range of motion.     Cervical back: Normal range of motion.  Neurological:     Mental Status: She is alert and oriented to person, place, and time.  Psychiatric:        Thought Content: Thought content normal.    Review of Systems  Constitutional:  Negative for fever.  HENT:  Negative for hearing loss.   Eyes:  Negative for blurred vision.  Respiratory:  Negative for cough.   Cardiovascular:  Negative for chest pain.  Gastrointestinal:  Negative for heartburn.  Genitourinary:  Negative for dysuria.  Musculoskeletal:  Negative for myalgias.  Skin:  Negative for rash.  Neurological:  Negative for dizziness.  Endo/Heme/Allergies:  Does not bruise/bleed easily.  Psychiatric/Behavioral:  Positive for depression (resolving). Negative for hallucinations (Psychosis has resolved. Pt denies AVH, denies paranoia and there is no evidence of delusional thinking. She verbally contracts for safety outside of [facility]), memory loss, substance abuse and suicidal ideas. The patient is nervous/anxious (resolving) and has insomnia (resolving).    Blood pressure 111/67, pulse 75, temperature 98.2 F (36.8 C), temperature source Oral, resp. rate 18, height 5\' 9"  (1.753 m), weight 109.3 kg, last menstrual period 10/29/2022, SpO2 100 %. Body mass index is 35.59 kg/m.  Mental Status Per Nursing Assessment::   On Admission:  NA  Demographic Factors:  Adolescent or young adult and Unemployed  Loss Factors: NA  Historical Factors: NA  Risk Reduction Factors:   Living with another person, especially a relative and Positive social  support  Continued Clinical Symptoms:  Pt's psychosis has resolved on current medications. She is denying SI, denies HI, denies AVH, denies paranoia, and there is no evidence of delusional thoughts. She is verbally contracting for safety outside of this [facility].  Cognitive Features That Contribute To Risk:  None    Suicide Risk:  Mild:  There are no identifiable suicide plans, no associated intent, mild dysphoria and related symptoms, good self-control (both objective and subjective assessment), few other risk factors, and identifiable protective factors, including available and accessible social support.    Follow-up Information     [facility] Go to.   Why: You have a hospital follow up appointment on 11/13/2022 at 10am at this location for med management and therapy.  Fax discharge paperwork to [phone]. Contact information: [address] [place] [address] [phone]                Starleen Blue, NP 11/09/2022, 10:53 AM

## 2022-11-09 NOTE — Progress Notes (Signed)
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 female  who presented to the [facility] on 04/24 with complaints of racing thoughts, insomnia, hyper religiosity, and "mania". Pt previously diagnoses with bipolar d/o, anxiety & depression.  Patient was transferred voluntarily on 02/23 to this [facility] for treatment and stabilization of her mental status.   24 hr chart review: Sleep Hours last night: 7.25 hrs  Nursing Concerns: None reported  Behavioral episodes in the past 24 hrs: None  Medication Compliance: Compliant  Vital Signs in the past 24 hrs: Slight elevation in HR of 112 earlier today morning. A review of flow sheets show that this has not been persistent. PRN Medications in the past 24 hrs: Trazodone & Hydroxyzine    Patient Assessment note: Today, speech is coherent and organized, she is logical, speech ir organized and she is responding appropriately to questions being asked. She denies SI/HI/AVH, denies paranoia and there is no evidence of delusional thinking. She is not guarded as compared to time admission and the few days following admission to [facility]. She reports a good sleep quality last night, reports a good appetite, denies medication related side effects, denies being in any physical distress. No TD/EPS type symptoms found on assessment, and pt denies any feelings of stiffness. AIMS: 0.   Pt is verbalizing readiness for discharge, and as per objective assessment, mood and thoughts have improved significantly since admission. Pt educated that we would need to make an appointment for f/u on the outpatient prior to discharging her. Pt educated  that safety planning with her parents would also need to be completed, and verbalizes tomorrow. Plan is for CSW to complete the above tomorrow since outpatient practices are closed over the weekends.   We discussed LAI medication options today with pt, but she is non receptive at this time, states that she will reach out to her parents and ask for their opinion prior to making a decision. We will change Risperdal disintegrating tabs to regular Risperdal pills in anticipation of discharge as the disintegrating may be more costly for pt. Continuing other medications as listed below. Assigned RN asked to recheck HR. Hydration encouraged.  Total Time spent with patient: 45 minutes  Past Psychiatric History: See H & P  Past Medical History:  Past Medical History:  Diagnosis Date   Anxiety    Bipolar disorder (HCC)    Deliberate self-cutting    Depression    No past surgical history on file. Family History:  Family History  Problem Relation Age of Onset   Cancer Other    Diabetes Paternal Grandmother    Family Psychiatric  History: See H & P Social History:  Social History   Substance and Sexual Activity  Alcohol Use Never   Alcohol/week: 0.0 standard drinks of alcohol     Social History   Substance and Sexual Activity  Drug Use Not Currently   Types: Solvent inhalants    Social History   Socioeconomic History   Marital status: Single    Spouse name: Not on file   Number of children: 0   Years of education: Not on file   Highest education level: 10th grade  Occupational History  Occupation: Consulting civil engineer  Tobacco Use   Smoking status: Never   Smokeless tobacco: Never  Vaping Use   Vaping Use: Never used  Substance and Sexual Activity   Alcohol use: Never    Alcohol/week: 0.0 standard drinks of alcohol   Drug use: Not Currently    Types: Solvent inhalants   Sexual activity: Never  Other Topics Concern   Not on file  Social History Narrative   Pt is an 11th grader at  [employer].  She lives with mother, father, and siblings.  Pt is followed by [facility] outpatient psychiatry for treatment of Bipolar I.   Social Determinants of Health   Financial Resource Strain: Not on file  Food Insecurity: Patient Declined (11/03/2022)   Hunger Vital Sign    Worried About Running Out of Food in the Last Year: Patient declined    Ran Out of Food in the Last Year: Patient declined  Transportation Needs: Patient Declined (11/03/2022)   PRAPARE - Administrator, Civil Service (Medical): Patient declined    Lack of Transportation (Non-Medical): Patient declined  Physical Activity: Not on file  Stress: Not on file  Social Connections: Not on file   Sleep: Good  Appetite:  Good  Current Medications: Current Facility-Administered Medications  Medication Dose Route Frequency Provider Last Rate Last Admin   acetaminophen (TYLENOL) tablet 650 mg  650 mg Oral Q6H PRN Onuoha, Chinwendu V, NP       alum & mag hydroxide-simeth (MAALOX/MYLANTA) 200-200-20 MG/5ML suspension 30 mL  30 mL Oral Q4H PRN Onuoha, Chinwendu V, NP       haloperidol (HALDOL) tablet 5 mg  5 mg Oral TID PRN Phineas Inches, MD   5 mg at 11/05/22 2358   And   LORazepam (ATIVAN) tablet 2 mg  2 mg Oral TID PRN Phineas Inches, MD   2 mg at 11/05/22 2358   And   diphenhydrAMINE (BENADRYL) capsule 50 mg  50 mg Oral TID PRN Phineas Inches, MD   50 mg at 11/05/22 2358   haloperidol lactate (HALDOL) injection 5 mg  5 mg Intramuscular TID PRN Massengill, Harrold Donath, MD       And   LORazepam (ATIVAN) injection 2 mg  2 mg Intramuscular TID PRN Massengill, Harrold Donath, MD       And   diphenhydrAMINE (BENADRYL) injection 50 mg  50 mg Intramuscular TID PRN Massengill, Harrold Donath, MD       hydrOXYzine (ATARAX) tablet 25 mg  25 mg Oral TID PRN Onuoha, Chinwendu V, NP   25 mg at 11/09/22 0808   magnesium hydroxide (MILK OF MAGNESIA) suspension 30 mL  30 mL Oral Daily PRN Onuoha, Chinwendu V, NP   30  mL at 11/09/22 0808   risperiDONE (RISPERDAL) tablet 1.5 mg  1.5 mg Oral QHS Fara Worthy, NP   1.5 mg at 11/08/22 2043   traZODone (DESYREL) tablet 100 mg  100 mg Oral QHS PRN Rex Kras, MD   100 mg at 11/08/22 2043    Lab Results: No results found for this or any previous visit (from the past 48 hour(s)).  Blood Alcohol level:  Lab Results  Component Value Date   [facility] <10 11/01/2022   ETH <10 11/05/2019    Metabolic Disorder Labs: Lab Results  Component Value Date   HGBA1C 4.8 11/06/2022   MPG 91.06 11/06/2022   MPG 93.93 01/16/2018   Lab Results  Component Value Date   PROLACTIN 29.5 (H) 01/10/2018   Lab Results  Component Value Date   CHOL 118 11/05/2022   TRIG 59 11/05/2022   HDL 49 11/05/2022   CHOLHDL 2.4 11/05/2022   VLDL 12 11/05/2022   LDLCALC 57 11/05/2022   LDLCALC 55 01/16/2018    Physical Findings: AIMS: Facial and Oral Movements Muscles of Facial Expression: None, normal Lips and Perioral Area: None, normal Jaw: None, normal Tongue: None, normal,Extremity Movements Upper (arms, wrists, hands, fingers): None, normal Lower (legs, knees, ankles, toes): None, normal, Trunk Movements Neck, shoulders, hips: None, normal, Overall Severity Severity of abnormal movements (highest score from questions above): None, normal Incapacitation due to abnormal movements: None, normal Patient's awareness of abnormal movements (rate only patient's report): No Awareness, Dental Status Current problems with teeth and/or dentures?: No Does patient usually wear dentures?: No  CIWA:    COWS:     Musculoskeletal: Strength & Muscle Tone: within normal limits Gait & Station: normal Patient leans: N/A  Psychiatric Specialty Exam:  Presentation  General Appearance:  Appropriate for Environment; Casual; Fairly Groomed  Eye Contact: Good  Speech: Normal Rate; Clear and Coherent  Speech Volume: Normal  Handedness: Right   Mood and Affect   Mood: Euthymic  Affect: Appropriate; Congruent; Full Range   Thought Process  Thought Processes: Linear  Descriptions of Associations:Intact  Orientation:Full (Time, Place and Person)  Thought Content:Logical  History of Schizophrenia/Schizoaffective disorder:Yes Duration of Psychotic Symptoms:Less than six months Hallucinations:Hallucinations: None  Ideas of Reference:None  Suicidal Thoughts:Suicidal Thoughts: No  Homicidal Thoughts:Homicidal Thoughts: No   Sensorium  Memory: Immediate Good; Recent Good; Remote Good  Judgment: Fair  Insight: Fair   Art therapist  Concentration: Fair  Attention Span: Fair  Recall: Good  Fund of Knowledge: Good  Language: Good   Psychomotor Activity  Psychomotor Activity: Psychomotor Activity: Normal   Assets  Assets: Communication Skills; Resilience; Social Support   Sleep  Sleep: Sleep: Fair    Physical Exam: Physical Exam Constitutional:      Appearance: Normal appearance.  HENT:     Nose: No congestion or rhinorrhea.  Eyes:     Pupils: Pupils are equal, round, and reactive to light.  Pulmonary:     Effort: Pulmonary effort is normal.  Musculoskeletal:        General: Normal range of motion.     Cervical back: Normal range of motion.  Neurological:     Mental Status: She is alert and oriented to person, place, and time.  Psychiatric:        Thought Content: Thought content normal.    Review of Systems  Constitutional:  Negative for fever.  HENT:  Negative for hearing loss.   Eyes:  Negative for blurred vision.  Respiratory:  Negative for cough.   Cardiovascular:  Negative for chest pain.  Gastrointestinal:  Negative for heartburn.  Genitourinary:  Negative for dysuria.  Musculoskeletal:  Negative for myalgias.  Skin:  Negative for rash.  Neurological:  Negative for dizziness.  Psychiatric/Behavioral:  Positive for depression. Negative for hallucinations, memory loss,  substance abuse and suicidal ideas. The patient is nervous/anxious and has insomnia.    Blood pressure 133/74, pulse (!) 124, temperature 98.2 F (36.8 C), temperature source Oral, resp. rate 18, height 5\' 9"  (1.753 m), weight 109.3 kg, last menstrual period 10/29/2022, SpO2 100 %. Body mass index is 35.59 kg/m.  Treatment Plan Summary: Daily contact with patient to assess and evaluate symptoms and progress in treatment and Medication management   Observation Level/Precautions:  15 minute checks  Laboratory:  Labs  reviewed :First time psychosis work up [employer] see Labs ordered  Psychotherapy:  Unit Group sessions  Medications:  See [facility]  Consultations:  To be determined   Discharge Concerns:  Safety, medication compliance, mood stability  Estimated LOS: 5-7 days  Other:  N/A      PLAN Safety and Monitoring: Voluntary admission to inpatient psychiatric unit for safety, stabilization and treatment Daily contact with patient to assess and evaluate symptoms and progress in treatment Patient's case to be discussed in multi-disciplinary team meeting Observation Level : q15 minute checks Vital signs: q12 hours Precautions: Safety    Long Term Goal(s): Improvement in symptoms so as ready for discharge   Short Term Goals: Ability to identify changes in lifestyle to reduce recurrence of condition will improve, Ability to demonstrate self-control will improve, Ability to identify and develop effective coping behaviors will improve, and Compliance with prescribed medications will improve   Diagnoses  Principal Problem:   Schizophrenia spectrum disorder with psychotic disorder type not yet determined Active Problems:   Insomnia   Anxiety state   Medications -Continue Risperdal 1.5 mg nightly for psychosis -Switch from M-tabs to regular Risperdal tablets -Continue Trazodone 100 mg nightly PRN for sleep -Continue Hydroxyzine 25 mg TID PRN for anxiety -Agitation Protocol: Start  Benadryl/Ativan/Haldol PRN-See MAR for complete order   Other PRNS -Continue Tylenol 650 mg every 6 hours PRN for mild pain -Continue Maalox 30 mg every 4 hrs PRN for indigestion -Continue Milk of Magnesia as needed every 6 hrs for constipation   Discharge Planning: Social work and case management to assist with discharge planning and identification of hospital follow-up needs prior to discharge Estimated LOS: 5-7 days Discharge Concerns: Need to establish a safety plan; Medication compliance and effectiveness Discharge Goals: Return home with outpatient referrals for mental health follow-up including medication management/psychotherapy   I certify that inpatient services furnished can reasonably be expected to improve the patient's condition.      Starleen Blue, NP 11/09/2022, 9:46 AM

## 2022-11-09 NOTE — Progress Notes (Signed)
  [facility] Adult Case Management Discharge Plan :  Will you be returning to the same living situation after discharge:  Yes,  home At discharge, do you have transportation home?: Yes,  parents will pick up Do you have the ability to pay for your medications: Yes,  insurance  Release of information consent forms completed and in the chart;  Patient's signature needed at discharge.  Patient to Follow up at:  Follow-up Information     [facility] Go to.   Why: You have a hospital follow up appointment on 11/13/2022 at 10am at this location for med management and therapy.  Fax discharge paperwork to [phone]. Contact information: [address] [place] [address] [phone]                 Next level of care provider has access to [facility] Link:no  Safety Planning and Suicide Prevention discussed: Yes,  with patient, CSW attempted to contact Parents but they did not answer. Provider notified.  Provider was able to contact and provide treatment summary earlier in admission     Has patient been referred to the Quitline?: N/A patient is not a smoker  Patient has been referred for addiction treatment: N/A  Lenetta Piche E Zaylin Runco, LCSW 11/09/2022, 9:55 AM

## 2022-11-09 NOTE — Discharge Instructions (Signed)
-  Follow-up with your outpatient psychiatric provider -instructions on appointment date, time, and address (location) are provided to you in discharge paperwork.  -Take your psychiatric medications as prescribed at discharge - instructions are provided to you in the discharge paperwork  -Follow-up with outpatient primary care doctor and other specialists -for management of preventative medicine and any chronic medical disease.  -Recommend abstinence from alcohol, tobacco, and other illicit drug use at discharge.   -If your psychiatric symptoms recur, worsen, or if you have side effects to your psychiatric medications, call your outpatient psychiatric provider, 911, [phone] or go to the nearest emergency department.  -If suicidal thoughts occur, call your outpatient psychiatric provider, 911, [phone] or go to the nearest emergency department.  Naloxone (Narcan) can help reverse an overdose when given to the victim quickly.  [place] offers free naloxone kits and instructions/training on its use.  Add naloxone to your first aid kit and you can help save a life.   Pick up your free kit at the following locations:   [place]:  [facility], [address] [place] [address] ([phone]) [facility] [address] [place] [id number] ([phone]) [facility] [address] [place] [address]  [place]: [facility] [address] [place] [address] ([phone]) [facility] [address] [place] [address] ([phone])  

## 2022-11-09 NOTE — Discharge Summary (Signed)
Physician Discharge Summary Note  Patient:  Kara Nguyen is an 22 y.o., female MRN:  [id number] DOB:  06-30-2001 Patient phone:  [phone] (home)  Patient address:   [address] [place] [address],  Total Time spent with patient: 45 minutes  Date of Admission:  11/03/2022 Date of Discharge: 11/09/2022  Reason for Admission: Kara Nguyen is a 22 y.o. female who presented to the [facility] on 04/24 with complaints of racing thoughts, insomnia, hyper religiosity, and "mania". Pt previously diagnoses with bipolar d/o, anxiety & depression. Patient was transferred voluntarily on 02/23 to this [facility] for treatment and stabilization of her mental status.   Principal Problem: Schizophrenia spectrum disorder with psychotic disorder type not yet determined [facility]) Discharge Diagnoses: Principal Problem:   Schizophrenia spectrum disorder with psychotic disorder type not yet determined (HCC) Active Problems:   Insomnia   Anxiety state  Past Psychiatric History: See H & P  Past Medical History:  Past Medical History:  Diagnosis Date   Anxiety    Bipolar disorder (HCC)    Deliberate self-cutting    Depression    No past surgical history on file. Family History:  Family History  Problem Relation Age of Onset   Cancer Other    Diabetes Paternal Grandmother    Family Psychiatric  History: See H & P Social History:  Social History   Substance and Sexual Activity  Alcohol Use Never   Alcohol/week: 0.0 standard drinks of alcohol     Social History   Substance and Sexual Activity  Drug Use Not Currently   Types: Solvent inhalants    Social History   Socioeconomic History   Marital status: Single    Spouse name: Not on file   Number of children: 0   Years of education: Not on file   Highest education level: 10th grade  Occupational History   Occupation: Consulting civil engineer  Tobacco Use   Smoking status: Never   Smokeless tobacco: Never  Vaping Use   Vaping Use: Never  used  Substance and Sexual Activity   Alcohol use: Never    Alcohol/week: 0.0 standard drinks of alcohol   Drug use: Not Currently    Types: Solvent inhalants   Sexual activity: Never  Other Topics Concern   Not on file  Social History Narrative   Pt is an 11th grader at [employer].  She lives with mother, father, and siblings.  Pt is followed by [facility] outpatient psychiatry for treatment of Bipolar I.   Social Determinants of Health   Financial Resource Strain: Not on file  Food Insecurity: Patient Declined (11/03/2022)   Hunger Vital Sign    Worried About Running Out of Food in the Last Year: Patient declined    Ran Out of Food in the Last Year: Patient declined  Transportation Needs: Patient Declined (11/03/2022)   PRAPARE - Administrator, Civil Service (Medical): Patient declined    Lack of Transportation (Non-Medical): Patient declined  Physical Activity: Not on file  Stress: Not on file  Social Connections: Not on file   Hospital Course:   During the patient's hospitalization, patient had extensive initial psychiatric evaluation, and follow-up psychiatric evaluations every day. Psychiatric diagnoses provided upon initial assessment are as listed above. Patient's psychiatric medications were adjusted on admission as follows: -Started Risperdal 1 mg BID for psychosis  -Continued Trazodone 50 mg nightly PRN for sleep -Continued Hydroxyzine 25 mg TID PRN for anxiety -Agitation Protocol: Start Benadryl/Ativan/Haldol PRN   During the hospitalization,  other adjustments were made to the patient's psychiatric medication regimen. Medications at discharge are as follows: -Continue Risperdal 1.5 mg nightly for psychosis/stabilization -Continue Trazodone 100 mg nightly PRN for sleep -Continue Hydroxyzine 25 mg TID PRN for anxiety   Patient's care was discussed during the interdisciplinary team meeting every day during the hospitalization. The patient denies  having side effects to prescribed psychiatric medication. Gradually, patient started adjusting to milieu. The patient was evaluated each day by a clinical provider to ascertain response to treatment. Improvement was noted by the patient's report of decreasing symptoms, improved sleep and appetite, affect, medication tolerance, behavior, and participation in unit programming.  Patient was asked each day to complete a self inventory noting mood, mental status, pain, new symptoms, anxiety and concerns.     Symptoms were reported as significantly decreased or resolved completely by discharge. On day of discharge, the patient reports that their mood is stable. The patient denied having suicidal thoughts for more than 48 hours prior to discharge.  Patient denies having homicidal thoughts.  Patient denies having auditory hallucinations.  Patient denies any visual hallucinations or other symptoms of psychosis. The patient was motivated to continue taking medication with a goal of continued improvement in mental health.    The patient reports their target psychiatric symptoms of depression, psychosis & insomnia responded well to the psychiatric medications, and the patient reports overall benefit from this psychiatric hospitalization. Supportive psychotherapy was provided to the patient. The patient also participated in regular group therapy while hospitalized. Coping skills, problem solving as well as relaxation therapies were also part of the unit programming.   Labs were reviewed with the patient, and abnormal results were discussed with the patient. During this hospitalization, pt had extensive work up, to rule out medical reasons for her psychosis. First time psychosis labs were completed. All were negative with the exception of N-methyl-D-Aspartate Recpt.IgG and heavy metal which are still in process. Pt educated to follow this up with her PCP.    The patient is able to verbalize their individual safety plan to  this provider.   # It is recommended to the patient to continue psychiatric medications as prescribed, after discharge from the hospital.     # It is recommended to the patient to follow up with your outpatient psychiatric provider and PCP.   # It was discussed with the patient, the impact of alcohol, drugs, tobacco have been there overall psychiatric and medical wellbeing, and total abstinence from substance use was recommended the patient.ed.   # Prescriptions provided or sent directly to preferred pharmacy at discharge. Patient agreeable to plan. Given opportunity to ask questions. Appears to feel comfortable with discharge.    # In the event of worsening symptoms, the patient is instructed to call the crisis hotline (988), 911 and or go to the nearest ED for appropriate evaluation and treatment of symptoms. To follow-up with primary care provider for other medical issues, concerns and or health care needs   # Patient was discharged home to her parents' home with a plan to follow up as noted below.    Total Time spent with patient: 45 minutes Physical Findings: AIMS: Facial and Oral Movements Muscles of Facial Expression: None, normal Lips and Perioral Area: None, normal Jaw: None, normal Tongue: None, normal,Extremity Movements Upper (arms, wrists, hands, fingers): None, normal Lower (legs, knees, ankles, toes): None, normal, Trunk Movements Neck, shoulders, hips: None, normal, Overall Severity Severity of abnormal movements (highest score from questions above): None, normal  Incapacitation due to abnormal movements: None, normal Patient's awareness of abnormal movements (rate only patient's report): No Awareness, Dental Status Current problems with teeth and/or dentures?: No Does patient usually wear dentures?: No  CIWA:    COWS:    AIMS: 0  Musculoskeletal: Strength & Muscle Tone: within normal limits Gait & Station: normal Patient leans: N/A  Psychiatric Specialty  Exam:  Presentation  General Appearance:  Appropriate for Environment; Fairly Groomed  Eye Contact: Good  Speech: Clear and Coherent  Speech Volume: Normal  Handedness: Right   Mood and Affect  Mood: Euthymic  Affect: Appropriate; Congruent   Thought Process  Thought Processes: Coherent  Descriptions of Associations:Intact  Orientation:Full (Time, Place and Person)  Thought Content:Logical  History of Schizophrenia/Schizoaffective disorder:Yes  Duration of Psychotic Symptoms:Greater than six months  Hallucinations:Hallucinations: None  Ideas of Reference:None  Suicidal Thoughts:Suicidal Thoughts: No  Homicidal Thoughts:Homicidal Thoughts: No   Sensorium  Memory: Immediate Good  Judgment: Fair  Insight: Fair   Art therapist  Concentration: Fair  Attention Span: Good  Recall: Good  Fund of Knowledge: Good  Language: Good   Psychomotor Activity  Psychomotor Activity: Psychomotor Activity: Normal   Assets  Assets: Communication Skills; Social Support; Resilience   Sleep  Sleep: Sleep: Good    Physical Exam: Physical Exam Constitutional:      Appearance: Normal appearance.  Eyes:     Pupils: Pupils are equal, round, and reactive to light.  Musculoskeletal:     Cervical back: Normal range of motion.  Skin:    General: Skin is warm.  Neurological:     Mental Status: She is alert and oriented to person, place, and time.    Review of Systems  Constitutional:  Negative for fever.  HENT:  Negative for hearing loss.   Eyes:  Negative for blurred vision.  Respiratory:  Negative for cough.   Cardiovascular:  Negative for chest pain.  Gastrointestinal:  Negative for heartburn.  Genitourinary:  Negative for dysuria.  Musculoskeletal:  Negative for myalgias.  Skin:  Negative for rash.  Neurological:  Negative for dizziness.  Psychiatric/Behavioral:  Positive for depression (Resolving). Negative for  hallucinations, memory loss, substance abuse and suicidal ideas. The patient is nervous/anxious (Resolved with current medications) and has insomnia (Resolved with current medications).    Blood pressure 111/67, pulse 75, temperature 98.2 F (36.8 C), temperature source Oral, resp. rate 18, height 5\' 9"  (1.753 m), weight 109.3 kg, last menstrual period 10/29/2022, SpO2 100 %. Body mass index is 35.59 kg/m.   Social History   Tobacco Use  Smoking Status Never  Smokeless Tobacco Never   Tobacco Cessation:  N/A, patient does not currently use tobacco products   Blood Alcohol level:  Lab Results  Component Value Date   ETH <10 11/01/2022   ETH <10 11/05/2019    Metabolic Disorder Labs:  Lab Results  Component Value Date   HGBA1C 4.8 11/06/2022   MPG 91.06 11/06/2022   MPG 93.93 01/16/2018   Lab Results  Component Value Date   PROLACTIN 29.5 (H) 01/10/2018   Lab Results  Component Value Date   CHOL 118 11/05/2022   TRIG 59 11/05/2022   HDL 49 11/05/2022   CHOLHDL 2.4 11/05/2022   VLDL 12 11/05/2022   LDLCALC 57 11/05/2022   LDLCALC 55 01/16/2018    See Psychiatric Specialty Exam and Suicide Risk Assessment completed by Attending Physician prior to discharge.  Discharge destination:  Home  Is patient on multiple antipsychotic therapies at discharge:  No   Has Patient had three or more failed trials of antipsychotic monotherapy by history:  No  Recommended Plan for Multiple Antipsychotic Therapies: NA  Discharge Instructions     Diet - low sodium heart healthy   Complete by: As directed    Increase activity slowly   Complete by: As directed       Allergies as of 11/09/2022       Reactions   Amoxicillin Hives, Itching        Medication List     STOP taking these medications    OLANZapine 5 MG tablet Commonly known as: ZYPREXA       TAKE these medications      Indication  hydrOXYzine 25 MG tablet Commonly known as: ATARAX Take 1 tablet (25  mg total) by mouth 3 (three) times daily as needed for anxiety. What changed:  how much to take how to take this when to take this reasons to take this additional instructions  Indication: Feeling Anxious   risperiDONE 0.5 MG tablet Commonly known as: RISPERDAL Take 3 tablets (1.5 mg total) by mouth at bedtime.  Indication: Schizophrenia, psychosis/mood stabilization   traZODone 100 MG tablet Commonly known as: DESYREL Take 1 tablet (100 mg total) by mouth at bedtime as needed for sleep.  Indication: Trouble Sleeping        Follow-up Information     [facility] Go to.   Why: You have a hospital follow up appointment on 11/13/2022 at 10am at this location for med management and therapy.  Fax discharge paperwork to [phone]. Contact information: [address] [place] [address] [phone]                Signed: Starleen Blue, NP 11/09/2022, 1:05 PM

## 2022-11-09 NOTE — Progress Notes (Signed)
Pt discharged to lobby. Pt was stable and appreciative at that time. All papers and prescriptions were given and valuables returned. Verbal understanding expressed. Denies SI/HI and A/VH. Pt given opportunity to express concerns and ask questions.  

## 2022-11-09 NOTE — [facility] Suicide Risk Assessment (Signed)
[facility] INPATIENT:  Family/Significant Other Suicide Prevention Education  Suicide Prevention Education:  Contact Attempts: Kara Nguyen Kara Nguyen and Kara Nguyen, [phone]  (name of family member/significant other) has been identified by the patient as the family member/significant other with whom the patient will be residing, and identified as the person(s) who will aid the patient in the event of a mental health crisis.  With written consent from the patient, two attempts were made to provide suicide prevention education, prior to and/or following the patient's discharge.  We were unsuccessful in providing suicide prevention education.  A suicide education pamphlet was given to the patient to share with family/significant other.  Date and time of first attempt:4/29 / 9am Date and time of second attempt:4/29 /9:30am   CSW attempted to call with interpreter and without, someone seemed to answer but did not speak in the phone.  CSW continued to attempt and was able to leave a voicemail for a call back.    Tallie Dodds E Mandy Peeks 11/09/2022, 9:33 AM

## 2022-11-10 LAB — HEAVY METALS, BLOOD
Arsenic: 4 ug/L (ref 0–9)
Lead: <1 ug/dL (ref 0.0–3.4)
Mercury: 4.2 ug/L (ref 0.0–14.9)

## 2022-12-06 ENCOUNTER — Encounter (HOSPITAL_COMMUNITY): Payer: Self-pay

## 2022-12-06 ENCOUNTER — Emergency Department (HOSPITAL_COMMUNITY)
Admission: EM | Admit: 2022-12-06 | Discharge: 2022-12-06 | Discharge status: Against Medical Advice | Payer: Medicaid Other | Source: Home / Self Care

## 2022-12-06 ENCOUNTER — Observation Stay (HOSPITAL_COMMUNITY)
Admission: EM | Admit: 2022-12-06 | Discharge: 2022-12-09 | Disposition: A | Discharge status: Other Acute Inpatient Hospital | Payer: Medicaid Other | Attending: Internal Medicine | Admitting: Internal Medicine

## 2022-12-06 ENCOUNTER — Other Ambulatory Visit: Payer: Self-pay

## 2022-12-06 ENCOUNTER — Ambulatory Visit (HOSPITAL_COMMUNITY)
Admission: EM | Admit: 2022-12-06 | Discharge: 2022-12-06 | Disposition: A | Discharge status: Home / Self Care | Payer: Medicaid Other | Source: Home / Self Care

## 2022-12-06 DIAGNOSIS — Z5321 Procedure and treatment not carried out due to patient leaving prior to being seen by health care provider: Secondary | ICD-10-CM | POA: Insufficient documentation

## 2022-12-06 DIAGNOSIS — Z79899 Other long term (current) drug therapy: Secondary | ICD-10-CM | POA: Diagnosis not present

## 2022-12-06 DIAGNOSIS — F28 Other psychotic disorder not due to a substance or known physiological condition: Secondary | ICD-10-CM | POA: Diagnosis not present

## 2022-12-06 DIAGNOSIS — G4700 Insomnia, unspecified: Secondary | ICD-10-CM | POA: Diagnosis present

## 2022-12-06 DIAGNOSIS — N390 Urinary tract infection, site not specified: Secondary | ICD-10-CM | POA: Diagnosis not present

## 2022-12-06 DIAGNOSIS — Z1152 Encounter for screening for COVID-19: Secondary | ICD-10-CM | POA: Insufficient documentation

## 2022-12-06 DIAGNOSIS — G479 Sleep disorder, unspecified: Secondary | ICD-10-CM | POA: Insufficient documentation

## 2022-12-06 DIAGNOSIS — F5102 Adjustment insomnia: Secondary | ICD-10-CM | POA: Insufficient documentation

## 2022-12-06 DIAGNOSIS — F319 Bipolar disorder, unspecified: Secondary | ICD-10-CM | POA: Insufficient documentation

## 2022-12-06 DIAGNOSIS — F411 Generalized anxiety disorder: Secondary | ICD-10-CM | POA: Diagnosis not present

## 2022-12-06 DIAGNOSIS — F5105 Insomnia due to other mental disorder: Secondary | ICD-10-CM

## 2022-12-06 DIAGNOSIS — F29 Unspecified psychosis not due to a substance or known physiological condition: Secondary | ICD-10-CM | POA: Diagnosis present

## 2022-12-06 LAB — URINALYSIS, ROUTINE W REFLEX MICROSCOPIC
Bacteria, UA: NONE SEEN
Bilirubin Urine: NEGATIVE
Glucose, UA: NEGATIVE mg/dL
Ketones, ur: NEGATIVE mg/dL
Nitrite: NEGATIVE
Protein, ur: 100 mg/dL — AB
RBC / HPF: >50 RBC/hpf (ref 0–5)
Specific Gravity, Urine: 1.012 (ref 1.005–1.030)
WBC, UA: >50 WBC/hpf (ref 0–5)
pH: 7 (ref 5.0–8.0)

## 2022-12-06 LAB — CBC WITH DIFFERENTIAL/PLATELET
Abs Immature Granulocytes: 0.04 10*3/uL (ref 0.00–0.07)
Basophils Absolute: 0 10*3/uL (ref 0.0–0.1)
Basophils Relative: 0 %
Eosinophils Absolute: 0.1 10*3/uL (ref 0.0–0.5)
Eosinophils Relative: 1 %
HCT: 34.2 % — ABNORMAL LOW (ref 36.0–46.0)
Hemoglobin: 11.2 g/dL — ABNORMAL LOW (ref 12.0–15.0)
Immature Granulocytes: 0 %
Lymphocytes Relative: 19 %
Lymphs Abs: 2.8 10*3/uL (ref 0.7–4.0)
MCH: 28.1 pg (ref 26.0–34.0)
MCHC: 32.7 g/dL (ref 30.0–36.0)
MCV: 85.7 fL (ref 80.0–100.0)
Monocytes Absolute: 0.9 10*3/uL (ref 0.1–1.0)
Monocytes Relative: 6 %
Neutro Abs: 10.9 10*3/uL — ABNORMAL HIGH (ref 1.7–7.7)
Neutrophils Relative %: 74 %
Platelets: 388 10*3/uL (ref 150–400)
RBC: 3.99 MIL/uL (ref 3.87–5.11)
RDW: 12.8 % (ref 11.5–15.5)
WBC: 14.8 10*3/uL — ABNORMAL HIGH (ref 4.0–10.5)
nRBC: 0 % (ref 0.0–0.2)

## 2022-12-06 LAB — RAPID URINE DRUG SCREEN, HOSP PERFORMED
Amphetamines: NOT DETECTED
Barbiturates: NOT DETECTED
Benzodiazepines: NOT DETECTED
Cocaine: NOT DETECTED
Opiates: NOT DETECTED
Tetrahydrocannabinol: NOT DETECTED

## 2022-12-06 LAB — COMPREHENSIVE METABOLIC PANEL
ALT: 18 U/L (ref 0–44)
AST: 16 U/L (ref 15–41)
Albumin: 4 g/dL (ref 3.5–5.0)
Alkaline Phosphatase: 73 U/L (ref 38–126)
Anion gap: 9 (ref 5–15)
BUN: 10 mg/dL (ref 6–20)
CO2: 22 mmol/L (ref 22–32)
Calcium: 8.9 mg/dL (ref 8.9–10.3)
Chloride: 106 mmol/L (ref 98–111)
Creatinine, Ser: 0.73 mg/dL (ref 0.44–1.00)
GFR, Estimated: >60 mL/min (ref >60)
Glucose, Bld: 104 mg/dL — ABNORMAL HIGH (ref 70–99)
Potassium: 3.9 mmol/L (ref 3.5–5.1)
Sodium: 137 mmol/L (ref 135–145)
Total Bilirubin: 0.5 mg/dL (ref 0.3–1.2)
Total Protein: 7.9 g/dL (ref 6.5–8.1)

## 2022-12-06 LAB — PREGNANCY, URINE: Preg Test, Ur: NEGATIVE

## 2022-12-06 LAB — ETHANOL: Alcohol, Ethyl (B): <10 mg/dL (ref <10)

## 2022-12-06 MED ORDER — HYDROXYZINE HCL 25 MG PO TABS
50.0000 mg | ORAL_TABLET | Freq: Once | ORAL | Status: AC
  Administered 2022-12-06: 50 mg via ORAL
  Filled 2022-12-06: qty 2

## 2022-12-06 MED ORDER — TRAZODONE HCL 100 MG PO TABS
100.0000 mg | ORAL_TABLET | Freq: Every day | ORAL | Status: DC
  Administered 2022-12-06 – 2022-12-08 (×2): 100 mg via ORAL
  Filled 2022-12-06 (×2): qty 1

## 2022-12-06 MED ORDER — RISPERIDONE 1 MG PO TABS
1.5000 mg | ORAL_TABLET | Freq: Every day | ORAL | Status: DC
  Administered 2022-12-06 – 2022-12-08 (×2): 1.5 mg via ORAL
  Filled 2022-12-06: qty 3
  Filled 2022-12-06: qty 2

## 2022-12-06 MED ORDER — HYDROXYZINE HCL 25 MG PO TABS
25.0000 mg | ORAL_TABLET | Freq: Once | ORAL | Status: DC
  Filled 2022-12-06: qty 1

## 2022-12-06 NOTE — ED Notes (Signed)
Pt has received their evening phone call. (Family)

## 2022-12-06 NOTE — ED Notes (Signed)
Called security to wand pt 

## 2022-12-06 NOTE — ED Triage Notes (Signed)
Pt. Arrives POV c/o insomnia for a few days. Pt. States that she thinks it's because she is on her period. Pt. Has a flat affect in triage. Parents state that they think she needs speak to a psychiatrist.

## 2022-12-06 NOTE — ED Provider Notes (Signed)
[place] EMERGENCY DEPARTMENT AT [facility] Provider Note   CSN: [id number] Arrival date & time: 12/06/22  1726     History {Add pertinent medical, surgical, social history, OB history to HPI:1} Chief Complaint  Patient presents with   Insomnia    Kara Nguyen is a 22 y.o. female with schizophrenia, bipolar idsorder, h/o mania, anxiety presents with insomnia. Accompanied by brother who provides additional history.   C/o insomnia for days. When asked how long she has been without sleep, patient states, "four," but cannot answer further questions. She is slow to answer and appears confused with the conversation. She is currently living with her parents and has h/o bipolar disorder w/ mania. She states she takes 3 meds, doesn't know what they are, but hasn't missed any doses.  Her brother was recently hospitalized inpatient for similar symptoms. Parents stated this happened after finding out a guy she was talking to was married.  She states she has had thoughts think she is God and that people are watching her, complaining of racing thoughts.  Denies any drug or alcohol use. Currently on menstrual period.  Denies SI/HI/AH/VH.  Patient states admitted from 11/03/22 to 11/09/2022 notes presented for very similar symptoms and discharged with a diagnosis of schizophrenia spectrum disorder with psychotic type.  Discharged with Risperdal 1.5 mg nightly, trazodone 100 mg nightly, hydroxyzine 25 mg 3 times daily as needed for anxiety.    Insomnia       Home Medications Prior to Admission medications   Medication Sig Start Date End Date Taking? Authorizing Provider  hydrOXYzine (ATARAX) 25 MG tablet Take 1 tablet (25 mg total) by mouth 3 (three) times daily as needed for anxiety. 11/09/22   Massengill, Harrold Donath, MD  risperiDONE (RISPERDAL) 0.5 MG tablet Take 3 tablets (1.5 mg total) by mouth at bedtime. 11/09/22 12/09/22  Massengill, Harrold Donath, MD  traZODone (DESYREL) 100 MG tablet  Take 1 tablet (100 mg total) by mouth at bedtime as needed for sleep. 11/09/22   Massengill, Harrold Donath, MD      Allergies    Amoxicillin    Review of Systems   Review of Systems  Psychiatric/Behavioral:  The patient has insomnia.    Review of systems {pos/neg:18640::"Negative","Positive"} for ***.  A 10 point review of systems was performed and is negative unless otherwise reported in HPI.  Physical Exam Updated Vital Signs BP (!) 144/98   Pulse (!) 101   Temp 99.2 F (37.3 C) (Oral)   Resp (!) 9   Wt 109 kg   SpO2 96%   BMI 35.49 kg/m  Physical Exam General: Normal appearing {Desc; female/female:11659}, lying in bed.  HEENT: PERRLA, Sclera anicteric, MMM, trachea midline.  Cardiology: RRR, no murmurs/rubs/gallops. BL radial and DP pulses equal bilaterally.  Resp: Normal respiratory rate and effort. CTAB, no wheezes, rhonchi, crackles.  Abd: Soft, non-tender, non-distended. No rebound tenderness or guarding.  GU: Deferred. MSK: No peripheral edema or signs of trauma. Extremities without deformity or TTP. No cyanosis or clubbing. Skin: warm, dry. No rashes or lesions. Back: No CVA tenderness Neuro: A&Ox4, CNs II-XII grossly intact. MAEs. Sensation grossly intact.  Psych: Normal mood and affect.   ED Results / Procedures / Treatments   Labs (all labs ordered are listed, but only abnormal results are displayed) Labs Reviewed  CBC WITH DIFFERENTIAL/PLATELET - Abnormal; Notable for the following components:      Result Value   WBC 14.8 (*)    Hemoglobin 11.2 (*)    HCT 34.2 (*)  Neutro Abs 10.9 (*)    All other components within normal limits  COMPREHENSIVE METABOLIC PANEL - Abnormal; Notable for the following components:   Glucose, Bld 104 (*)    All other components within normal limits  URINALYSIS, ROUTINE W REFLEX MICROSCOPIC - Abnormal; Notable for the following components:   Color, Urine RED (*)    APPearance HAZY (*)    Hgb urine dipstick LARGE (*)    Protein, ur  100 (*)    Leukocytes,Ua SMALL (*)    All other components within normal limits  ETHANOL  RAPID URINE DRUG SCREEN, HOSP PERFORMED  PREGNANCY, URINE    EKG EKG Interpretation  Date/Time:  Sunday Dec 06 2022 19:27:24 EDT Ventricular Rate:  106 PR Interval:  135 QRS Duration: 76 QT Interval:  325 QTC Calculation: 432 R Axis:   67 Text Interpretation: Sinus tachycardia Confirmed by Vivi Barrack [phone]) on 12/06/2022 8:47:43 PM  Radiology No results found.  Procedures Procedures  {Document cardiac monitor, telemetry assessment procedure when appropriate:1}  Medications Ordered in ED Medications  hydrOXYzine (ATARAX) tablet 50 mg (has no administration in time range)    ED Course/ Medical Decision Making/ A&P                          Medical Decision Making Amount and/or Complexity of Data Reviewed Labs: ordered. Decision-making details documented in ED Course.  Risk Prescription drug management.    This patient presents to the ED for concern of ***, this involves an extensive number of treatment options, and is a complaint that carries with it a high risk of complications and morbidity.  I considered the following differential and admission for this acute, potentially life threatening condition.   MDM:    ***  Clinical Course as of 12/06/22 2051  Sun Dec 06, 2022  2020 Rapid urine drug screen (hospital performed) [HN]  2020 Preg Test, Ur: NEGATIVE [HN]  2025 Urinalysis, Routine w reflex microscopic -Urine, Clean Catch(!) Patient on her menstrual cycle, no nitrites or bacteria, no UTI [HN]  2027 WBC(!): 14.8 Had similar increase in leukocytes with most recent manic/psychotic, denies any infectious symptoms [HN]  2047 Alcohol, Ethyl (B): <10 [HN]  2047 Comprehensive metabolic panel(!) Unremarkable. [HN]  2048 On chart review patient was seen by [facility] earlier today and DC'd w/ plan for hydroxyzine. I do believe that patient w/ odd affect, family is concerned she is  not acting normally w/ report of grandiose thoughts, though she is A&Ox4, I believe she would benefit from eval by psychiatry. Tachycardia is resolving. Patient will be given hydroxyzine 50 mg PO for anxiety per plan earlier by psych and consulted to TTS. Medically cleared pending TTS. [HN]    Clinical Course User Index [HN] Loetta Rough, MD    Labs: I Ordered, and personally interpreted labs.  The pertinent results include:  ***  Imaging Studies ordered: I ordered imaging studies including *** I independently visualized and interpreted imaging. I agree with the radiologist interpretation  Additional history obtained from ***.  External records from outside source obtained and reviewed including ***  Cardiac Monitoring: The patient was maintained on a cardiac monitor.  I personally viewed and interpreted the cardiac monitored which showed an underlying rhythm of: ***  Reevaluation: After the interventions noted above, I reevaluated the patient and found that they have :{resolved/improved/worsened:23923::"improved"}  Social Determinants of Health: ***  Disposition:  ***  Co morbidities that complicate the patient evaluation  Past Medical History:  Diagnosis Date   Anxiety    Bipolar disorder (HCC)    Deliberate self-cutting    Depression      Medicines Meds ordered this encounter  Medications   DISCONTD: hydrOXYzine (ATARAX) tablet 25 mg   hydrOXYzine (ATARAX) tablet 50 mg    I have reviewed the patients home medicines and have made adjustments as needed  Problem List / ED Course: Problem List Items Addressed This Visit   None        {Document critical care time when appropriate:1} {Document review of labs and clinical decision tools ie heart score, Chads2Vasc2 etc:1}  {Document your independent review of radiology images, and any outside records:1} {Document your discussion with family members, caretakers, and with consultants:1} {Document social  determinants of health affecting pt's care:1} {Document your decision making why or why not admission, treatments were needed:1}  This note was created using dictation software, which may contain spelling or grammatical errors.

## 2022-12-06 NOTE — Discharge Instructions (Signed)

## 2022-12-06 NOTE — ED Provider Notes (Signed)
Behavioral Health Urgent Care Medical Screening Exam  Patient Name: Kara Nguyen MRN: [id number] Date of Evaluation: 12/06/22 Chief Complaint:   " I am not sleeping" Diagnosis:  Final diagnoses:  Adjustment insomnia    History of Present illness: Kara Nguyen is a 22 y.o. female.  Presents to [facility] urgent care accompanied by her parents.  Patient is reporting she is having difficulty sleeping at night. Blessing speaks [place] however, Spanish translation was provided for additional collateral for her parents. Spanish translation ID number [id number].  Patient mother has concerns related to patient getting roughly 4 hours of sleep.  She reports she has not been given patient trazodone because she feels that it has adverse effects.  Patient had follow-up appointment with [facility].  However,  mother reports that they never made that appointment and was hoping to have medication adjustments.  Discussed that the need to establish care for medication management.  Plan: Consider taking 50 mg of hydroxyzine to help with insomnia of reported issues continue risperidone 0.5 mg nightly family was receptive to plan  She is speaking in a clear tone at low volume, and normal pace; with minimal eye contact.  She presents flat, slow to respond and guarded. her thought process is coherent and relevant; There is no indication that she is currently responding to internal/external stimuli or experiencing delusional thought content;  patient appears to be thought blocking, however she has denied suicidal/self-harm/homicidal ideation, psychosis, and paranoia. patient has remained calm throughout assessment and has answered questions appropriately.  She denied illicit drug use or substance abuse history.  Patient was recently discharged from inpatient admission 11/09/2022.  States she has been compliant with medications since her discharge.  [name] is educated and verbalizes understanding of mental  health resources and other crisis services in the community. She is instructed to call 911 and present to the nearest emergency room should she experience any suicidal/homicidal ideation, auditory/visual/hallucinations, or detrimental worsening of her mental health condition. She was a also advised by Clinical research associate that she could call the toll-free phone on insurance card to assist with identifying in network counselors and agencies or number on back of Medicaid card t speak with care coordinator   Flowsheet Row ED from 12/06/2022 in [facility] Most recent reading at 12/06/2022  7:25 AM ED from 12/06/2022 in [facility] Emergency Department at [facility] Most recent reading at 12/06/2022  5:49 AM [facility] (Dis[facility] Most recent reading at 11/03/2022 12:34 PM  C-SSRS RISK CATEGORY No Risk No Risk No Risk       Psychiatric Specialty Exam  Presentation  General Appearance:Appropriate for Environment; Fairly Groomed  Eye Contact:Good  Speech:Clear and Coherent  Speech Volume:Normal  Handedness:Right   Mood and Affect  Mood: Euthymic  Affect: Appropriate; Congruent   Thought Process  Thought Processes: Coherent  Descriptions of Associations:Intact  Orientation:Full (Time, Place and Person)  Thought Content:Logical  Diagnosis of Schizophrenia or Schizoaffective disorder in past: Yes  Duration of Psychotic Symptoms: Greater than six months  Hallucinations:None  Ideas of Reference:None  Suicidal Thoughts:No  Homicidal Thoughts:No   Sensorium  Memory: Immediate Good  Judgment: Fair  Insight: Fair   Art therapist  Concentration: Fair  Attention Span: Good  Recall: Good  Fund of Knowledge: Good  Language: Good   Psychomotor Activity  Psychomotor Activity: Normal   Assets  Assets: Communication Skills; Social Support; Resilience   Sleep   Sleep: Good  Number of hours: No data recorded  Physical Exam: Physical Exam Vitals and nursing note reviewed.  Cardiovascular:     Rate and Rhythm: Normal rate and regular rhythm.  Neurological:     Mental Status: She is alert and oriented to person, place, and time.  Psychiatric:        Mood and Affect: Mood normal.        Behavior: Behavior normal.    Review of Systems  Psychiatric/Behavioral:  Negative for depression, hallucinations and suicidal ideas. The patient is nervous/anxious and has insomnia.   All other systems reviewed and are negative.  Blood pressure (!) 140/95, pulse 100, temperature 98.6 F (37 C), temperature source Oral, resp. rate 18, SpO2 100 %. There is no height or weight on file to calculate BMI.  Musculoskeletal: Strength & Muscle Tone: within normal limits Gait & Station: normal Patient leans: N/A   [facility] MSE Discharge Disposition for Follow up and Recommendations: Based on my evaluation the patient does not appear to have an emergency medical condition and can be discharged with resources and follow up care in outpatient services for Medication Management  Keep follow-up with [facility]- walk in for medication adjustment.  Plan: Consider taking 50 mg of hydroxyzine to help with insomnia of reported issues continue risperidone 0.5 mg nightly family was receptive to plan  Oneta Rack, NP 12/06/2022, 8:07 AM

## 2022-12-06 NOTE — Progress Notes (Addendum)
   12/06/22 0700  [facility] Triage Screening (Walk-ins at [facility] only)  How Did You Hear About [place]? Hospital Discharge  What Is the Reason for Your Visit/Call Today? Pt presents to [facility] voluntarily accompanied by her parents. Pt states she came to this facility to speak with someone because the news has been causing her stress. Pt has flat affect and provides short answers to questions. Pt reports she lives with her parents and their relationship is "okay". Pt reports she is interested in outpatient therapy. Pt gives permission to speak with her father. Per fathers reports, the pt has been without the medication for 2 years and her symptoms have increased. Pts father states that the pts psychiatrist that she was seeing in the past has retired and they have been unable to find her a new psychiatrist. Pts father reports the pt was triggered after she found out a guy she was interested in was married.Per pts father the pt is diagnosed with Anxiety and Bipolar disorder.Pt denies SI/HI and AVH.  How Long Has This Been Causing You Problems? > than 6 months  Have You Recently Had Any Thoughts About Hurting Yourself? No  Are You Planning to Commit Suicide/Harm Yourself At This time? No  Have you Recently Had Thoughts About Hurting Someone Karolee Ohs? No  Are You Planning To Harm Someone At This Time? No  Are you currently experiencing any auditory, visual or other hallucinations? No  Have You Used Any Alcohol or Drugs in the Past 24 Hours? No  Do you have any current medical co-morbidities that require immediate attention? No  Clinician description of patient physical appearance/behavior: flat affect, wearing pajamas  What Do You Feel Would Help You the Most Today? Medication(s);Treatment for Depression or other mood problem  If access to [facility] was not available, would you have sought care in the Emergency Department? No  Determination of Need Routine (7 days)  Options For Referral Outpatient Therapy;Medication  Management

## 2022-12-06 NOTE — ED Notes (Signed)
Pt has been changed out into burgundy scrubs.

## 2022-12-06 NOTE — ED Triage Notes (Signed)
C/o insomnia for days. Average sleep is 4 hours/night.  Parents state this happened after finding out a guy she was talking to was married.  Currently on menstrual.  Hx anxiety and bipolar Denies si/hi

## 2022-12-07 ENCOUNTER — Encounter (HOSPITAL_COMMUNITY): Payer: Self-pay | Admitting: Psychiatry

## 2022-12-07 DIAGNOSIS — G4700 Insomnia, unspecified: Secondary | ICD-10-CM

## 2022-12-07 MED ORDER — DIPHENHYDRAMINE HCL 50 MG/ML IJ SOLN
INTRAMUSCULAR | Status: AC
  Administered 2022-12-07: 50 mg
  Filled 2022-12-07: qty 1

## 2022-12-07 MED ORDER — LORAZEPAM 2 MG/ML IJ SOLN
INTRAMUSCULAR | Status: AC
  Filled 2022-12-07: qty 1

## 2022-12-07 MED ORDER — DIPHENHYDRAMINE HCL 50 MG/ML IJ SOLN: 50.0000 mg | Freq: Once | INTRAMUSCULAR | Status: AC

## 2022-12-07 MED ORDER — ZIPRASIDONE MESYLATE 20 MG IM SOLR
20.0000 mg | Freq: Once | INTRAMUSCULAR | Status: AC
  Administered 2022-12-07: 20 mg via INTRAMUSCULAR

## 2022-12-07 MED ORDER — ZIPRASIDONE MESYLATE 20 MG IM SOLR
INTRAMUSCULAR | Status: AC
  Filled 2022-12-07: qty 20

## 2022-12-07 MED ORDER — LORAZEPAM 2 MG/ML IJ SOLN
2.0000 mg | Freq: Once | INTRAMUSCULAR | Status: AC
  Administered 2022-12-07: 2 mg via INTRAMUSCULAR

## 2022-12-07 MED ORDER — HYDROXYZINE HCL 25 MG PO TABS
25.0000 mg | ORAL_TABLET | Freq: Once | ORAL | Status: AC
  Administered 2022-12-07: 25 mg via ORAL
  Filled 2022-12-07: qty 1

## 2022-12-07 MED ORDER — STERILE WATER FOR INJECTION IJ SOLN
INTRAMUSCULAR | Status: AC
  Administered 2022-12-07: 1.2 mL
  Filled 2022-12-07: qty 10

## 2022-12-07 NOTE — ED Provider Notes (Signed)
Emergency Medicine Observation Re-evaluation Note  Kara Nguyen is a 22 y.o. female, seen on rounds today.  Pt initially presented to the ED for complaints of Insomnia Currently, the patient is patient sitting in the bed resting comfortably.  Reviewed last behavioral health note that states that they attempted to complete TTS but the cart was not working properly.Marland Kitchen  Physical Exam  BP (!) 137/108   Pulse (!) 106   Temp 98.3 F (36.8 C) (Oral)   Resp 19   Wt 109 kg   SpO2 97%   BMI 35.49 kg/m  Physical Exam General: Well-developed well-nourished female is in the bed does not appear to be in acute distress Cardiac: Heart is regular rate and rhythm although pulse rate recorded and last vitals 106 Lungs: Patient has no respiratory distress Psych: Affect is slightly flat but she is conversant and answers questions and states that she has been unable to sleep  ED Course / MDM  EKG:EKG Interpretation  Date/Time:  Sunday Dec 06 2022 19:27:24 EDT Ventricular Rate:  106 PR Interval:  135 QRS Duration: 76 QT Interval:  325 QTC Calculation: 432 R Axis:   67 Text Interpretation: Sinus tachycardia Confirmed by Vivi Barrack [phone]) on 12/06/2022 8:47:43 PM  I have reviewed the labs performed to date as well as medications administered while in observation.  Recent changes in the last 24 hours include patient was seen in the behavioral health urgent care and sent to the emergency department. Reviewed note from urgent care yesterday where she was evaluated for insomnia.  They advised that she consider taking 50 mg of hydroxyzine and continue risperidone nightly.  Follow-up ointment with [facility] was advised.  She was discharged from behavioral health urgent care.  She presented here in the emergency department same day with complaints of insomnia She is currently awaiting behavioral health evaluation  Plan  Current plan is for behavioral health evaluation.    Margarita Grizzle, MD 12/07/22  [phone]

## 2022-12-07 NOTE — Progress Notes (Signed)
Patient has been denied by [facility] due to no appropriate beds available. Patient meets BH inpatient criteria per Rande Brunt, NP. Patient has been faxed out to the following facilities:   [facility] Pending - Request [address]N/[address] Claris Gower [place] [phone]--[facility] Pending - Request [address] Lu D[place] [phone]--[facility] Pending - Request [address], [place] [phone]--VQQVZ-DGLOVFIE [facility] Pending - Request SentN/A2[address] [place], [place] [phone]--[facility] Pending - Request SentN/[address] [place] [phone]--[facility] Pending [facility] [address] Pricilla Larsson [facility] Pending - Request SentN/A[address] [facility] [place] [phone]--FTDDU-KGUR [facility] Pending - Request [phone] [address] [place] [address] [facility] [phone]--[facility] Pending - Request [address] [place], [place] [phone]--[facility] Pending - Request SentN/A[address] [place] [phone]--[facility] Pending - Request SentN/A[address] Rande Lawman [facility] [phone]--VOJJK-KXFG [facility] Pending - Request SentN/A[address] [place] [phone]--ENIDP-OEUMP [facility] Pending - Request SentN/A[address] [place] [phone]--TIWPY-KDXIPJ [facility] Pending - Request SentN/A[address]les[address] [facility] [phone]--HGDJM-EQAS [facility] Pending - Request [facility], [place] [phone]--GYJEH-UDJS [facility] Pending - Request [phone] Kathie Rhodes [address] [place] [phone]--EZMOQ-HUTMLYYT [facility] Pending - Request [address] Hessie D[place] [phone]--RFFMB-WGY [facility] Pending - Request SentN/A[address] [place] [phone]--[facility] Pending - Request SentN/A[address] [place] [place], [place] [phone]--MAUQJ-FHLK [facility] Pending - Request SentN/A[address] [place] [phone]--BTDHR-CBULAGT [facility] Pending - Request SentN/A[address], [place] [facility] Pending - Request SentN/[address] Genevie Cheshire [facility] [phone]--[facility] Pending - Request [address] [place] [phone]--VQXIH-WTU [facility] Pending - Request [phone] [address] Karolee Oh[address]., [facility] Pending - Request SentN/A[address], [place] [phone]--[facility] Pending - Request [address] Lacy [place]ver[place] [phone]-- Damita Dunnings, MSW, LCSW-A  8:17 PM 12/07/2022

## 2022-12-07 NOTE — Consult Note (Signed)
BH ED ASSESSMENT   Reason for Consult:  Insomnia,  Referring Physician:  Vivi Barrack, MD  Patient Identification: Kara Nguyen MRN:  [id number] ED Chief Complaint: "I am not sleeping" Diagnosis:  Active Problems:    Schizophrenia spectrum disorder with psychotic disorder type not yet determined (HCC)        Insomnia     ED Assessment Time Calculation: Start Time: 0920 Stop Time: 1024 Total Time in Minutes (Assessment Completion): 64   Subjective:   Kara Nguyen is a 22 y.o. female patient admitted with insomnia brought in by parents.  HPI:  During assessment the patient endorsed changes in sleep.  She endorses manic symptoms, including any distinct period of elevated or irritable mood, increase in activity, lack of sleep, grandiosity, and distractibility.   Regarding anxiety: patient reported generalized anxiety disorder symptoms including: excessive anxiety with reports of difficulty concentrating, irritability, muscle tension, sleep changes.  She states she is worried about "all the problems in the world".    Patient endorses psychotic symptoms including auditory hallucinations, delusion, and paranoia.  She reports isolating to her room; she presents with disorganized thoughts and behavior.  Patient stated she listens to music to drown out the voices.  The voices tell her she is God and indicates they started approximately 1 month ago.  She denies command hallucinations.  The patient also pointed to her right hand and stated "I am in there" and then pointed to her left hand and stated "my brother is in there".  At one point she said she has two brothers; after looking away and appearing to be responding to internal stimuli, she said "I have to tell you I have one brother".    During assessment patient is sitting on the edge of bed drawing with crayons and covering her head with a sheet; she denies being cold.  She was unable to state why she was covering her head. Patient  states she spends her days thinking and drawing; she reports spending most of her time in her room.  She states she argues with her parents, but could not verbalize the content of the arguments.  She repeated multiple times that she is catholic, that today is Sunday (it is actually Monday) and that her brother protects her by causing her to suffer.  Patient is slow to answer questions and seems confused.  She will often look away during the exam and ask that the question be repeated. We were alone in the room and she verbalized that there are others talking.  Past Psychiatric History:  Anxiety Bipolar disorder Depression   Risk to Self or Others: Is the patient at risk to self? No Has the patient been a risk to self in the past 6 months? No Has the patient been a risk to self within the distant past? No Is the patient a risk to others? No Has the patient been a risk to others in the past 6 months? No Has the patient been a risk to others within the distant past? No  [place] Scale:  Flowsheet Row ED from 12/06/2022 in [facility] Emergency Department at [facility] Most recent reading at 12/06/2022  5:46 PM ED from 12/06/2022 in [facility] Most recent reading at 12/06/2022  7:25 AM ED from 12/06/2022 in [facility] Emergency Department at [facility] Most recent reading at 12/06/2022  5:49 AM  C-SSRS RISK CATEGORY No Risk No Risk No Risk       AIMS:  , , ,  ,  ASAM:    Substance Abuse:     Past Medical History:  Past Medical History:  Diagnosis Date   Anxiety    Bipolar disorder (HCC)    Deliberate self-cutting    Depression    History reviewed. No pertinent surgical history. Family History:  Family History  Problem Relation Age of Onset   Cancer Other    Diabetes Paternal Grandmother    Family Psychiatric  History: Brother has a history of inpatient psychiatric stay.  Social History:  Social History   Substance and Sexual  Activity  Alcohol Use Never   Alcohol/week: 0.0 standard drinks of alcohol     Social History   Substance and Sexual Activity  Drug Use Not Currently   Types: Solvent inhalants    Social History   Socioeconomic History   Marital status: Single    Spouse name: Not on file   Number of children: 0   Years of education: Not on file   Highest education level: 10th grade  Occupational History   Occupation: Consulting civil engineer  Tobacco Use   Smoking status: Never   Smokeless tobacco: Never  Vaping Use   Vaping Use: Never used  Substance and Sexual Activity   Alcohol use: Never    Alcohol/week: 0.0 standard drinks of alcohol   Drug use: Not Currently    Types: Solvent inhalants   Sexual activity: Never  Other Topics Concern   Not on file  Social History Narrative   She lives with mother, father, and sibling.     Social Determinants of Health   Financial Resource Strain: Not on file  Food Insecurity: Patient Declined (11/03/2022)   Hunger Vital Sign    Worried About Running Out of Food in the Last Year: Patient declined    Ran Out of Food in the Last Year: Patient declined  Transportation Needs: Patient Declined (11/03/2022)   PRAPARE - Administrator, Civil Service (Medical): Patient declined    Lack of Transportation (Non-Medical): Patient declined  Physical Activity: Not on file  Stress: Not on file  Social Connections: Not on file   Additional Social History:    Allergies:   Allergies  Allergen Reactions   Amoxicillin Hives and Itching    Labs:  Results for orders placed or performed during the hospital encounter of 12/06/22 (from the past 48 hour(s))  Rapid urine drug screen (hospital performed)     Status: None   Collection Time: 12/06/22  7:12 PM  Result Value Ref Range   Opiates NONE DETECTED NONE DETECTED   Cocaine NONE DETECTED NONE DETECTED   Benzodiazepines NONE DETECTED NONE DETECTED   Amphetamines NONE DETECTED NONE DETECTED    Tetrahydrocannabinol NONE DETECTED NONE DETECTED   Barbiturates NONE DETECTED NONE DETECTED    Comment: (NOTE) DRUG SCREEN FOR MEDICAL PURPOSES ONLY.  IF CONFIRMATION IS NEEDED FOR ANY PURPOSE, NOTIFY LAB WITHIN 5 DAYS.  LOWEST DETECTABLE LIMITS FOR URINE DRUG SCREEN Drug Class                     Cutoff (ng/mL) Amphetamine and metabolites    1000 Barbiturate and metabolites    200 Benzodiazepine                 200 Opiates and metabolites        300 Cocaine and metabolites        300 THC  50 Performed at [facility], [address] [place], [place] [address]   Urinalysis, Routine w reflex microscopic -Urine, Clean Catch     Status: Abnormal   Collection Time: 12/06/22  7:12 PM  Result Value Ref Range   Color, Urine RED (A) YELLOW   APPearance HAZY (A) CLEAR   Specific Gravity, Urine 1.012 1.005 - 1.030   pH 7.0 5.0 - 8.0   Glucose, UA NEGATIVE NEGATIVE mg/dL   Hgb urine dipstick LARGE (A) NEGATIVE   Bilirubin Urine NEGATIVE NEGATIVE   Ketones, ur NEGATIVE NEGATIVE mg/dL   Protein, ur 119 (A) NEGATIVE mg/dL   Nitrite NEGATIVE NEGATIVE   Leukocytes,Ua SMALL (A) NEGATIVE   RBC / HPF >50 0 - 5 RBC/hpf   WBC, UA >50 0 - 5 WBC/hpf   Bacteria, UA NONE SEEN NONE SEEN   Squamous Epithelial / HPF 0-5 0 - 5 /HPF   WBC Clumps PRESENT     Comment: Performed at [facility], [address], [place], [place] [address]  Pregnancy, urine     Status: None   Collection Time: 12/06/22  7:12 PM  Result Value Ref Range   Preg Test, Ur NEGATIVE NEGATIVE    Comment:        THE SENSITIVITY OF THIS METHODOLOGY IS >20 mIU/mL. Performed at [facility], [address], [place], [place] [address]   CBC with Differential     Status: Abnormal   Collection Time: 12/06/22  8:06 PM  Result Value Ref Range   WBC 14.8 (H) 4.0 - 10.5 K/uL   RBC 3.99 3.87 - 5.11 MIL/uL   Hemoglobin 11.2 (L) 12.0 - 15.0 g/dL   HCT  30.8 (L) 65.7 - 46.0 %   MCV 85.7 80.0 - 100.0 fL   MCH 28.1 26.0 - 34.0 pg   MCHC 32.7 30.0 - 36.0 g/dL   RDW 84.6 96.2 - 95.2 %   Platelets 388 150 - 400 K/uL   nRBC 0.0 0.0 - 0.2 %   Neutrophils Relative % 74 %   Neutro Abs 10.9 (H) 1.7 - 7.7 K/uL   Lymphocytes Relative 19 %   Lymphs Abs 2.8 0.7 - 4.0 K/uL   Monocytes Relative 6 %   Monocytes Absolute 0.9 0.1 - 1.0 K/uL   Eosinophils Relative 1 %   Eosinophils Absolute 0.1 0.0 - 0.5 K/uL   Basophils Relative 0 %   Basophils Absolute 0.0 0.0 - 0.1 K/uL   Immature Granulocytes 0 %   Abs Immature Granulocytes 0.04 0.00 - 0.07 K/uL    Comment: Performed at [facility], [address] [place], [place] [address]  Comprehensive metabolic panel     Status: Abnormal   Collection Time: 12/06/22  8:06 PM  Result Value Ref Range   Sodium 137 135 - 145 mmol/L   Potassium 3.9 3.5 - 5.1 mmol/L   Chloride 106 98 - 111 mmol/L   CO2 22 22 - 32 mmol/L   Glucose, Bld 104 (H) 70 - 99 mg/dL    Comment: Glucose reference range applies only to samples taken after fasting for at least 8 hours.   BUN 10 6 - 20 mg/dL   Creatinine, Ser 4.40 0.44 - 1.00 mg/dL   Calcium 8.9 8.9 - 10.2 mg/dL   Total Protein 7.9 6.5 - 8.1 g/dL   Albumin 4.0 3.5 - 5.0 g/dL   AST 16 15 - 41 U/L   ALT 18 0 - 44  U/L   Alkaline Phosphatase 73 38 - 126 U/L   Total Bilirubin 0.5 0.3 - 1.2 mg/dL   GFR, Estimated >40 >98 mL/min    Comment: (NOTE) Calculated using the CKD-EPI Creatinine Equation (2021)    Anion gap 9 5 - 15    Comment: Performed at [facility], [address], [place], [place] [address]  Ethanol     Status: None   Collection Time: 12/06/22  8:06 PM  Result Value Ref Range   Alcohol, Ethyl (B) <10 <10 mg/dL    Comment: (NOTE) Lowest detectable limit for serum alcohol is 10 mg/dL.  For medical purposes only. Performed at [facility], [address], [place], [place] [address]     Current  Facility-Administered Medications  Medication Dose Route Frequency Provider Last Rate Last Admin   risperiDONE (RISPERDAL) tablet 1.5 mg  1.5 mg Oral QHS Loetta Rough, MD   1.5 mg at 12/06/22 2258   traZODone (DESYREL) tablet 100 mg  100 mg Oral QHS Loetta Rough, MD   100 mg at 12/06/22 2259   Current Outpatient Medications  Medication Sig Dispense Refill   hydrOXYzine (ATARAX) 25 MG tablet Take 1 tablet (25 mg total) by mouth 3 (three) times daily as needed for anxiety. 30 tablet 0   risperiDONE (RISPERDAL) 0.5 MG tablet Take 3 tablets (1.5 mg total) by mouth at bedtime. 90 tablet 0   traZODone (DESYREL) 100 MG tablet Take 1 tablet (100 mg total) by mouth at bedtime as needed for sleep. 30 tablet 0    Musculoskeletal: Strength & Muscle Tone: within normal limits Gait & Station: normal Patient leans: N/A   Psychiatric Specialty Exam: Presentation  General Appearance:  Disheveled  Eye Contact: Fleeting  Speech: Blocked  Speech Volume: Decreased  Handedness: Right   Mood and Affect  Mood: Anxious  Affect: Congruent   Thought Process  Thought Processes: Disorganized  Descriptions of Associations:Tangential  Orientation:Partial  Thought Content:Delusions  History of Schizophrenia/Schizoaffective disorder:Yes  Duration of Psychotic Symptoms:Less than six months  Hallucinations:Hallucinations: Auditory Description of Auditory Hallucinations: Pt is hearing voices telling her she is God.  Ideas of Reference:Delusions  Suicidal Thoughts:Suicidal Thoughts: No  Homicidal Thoughts:Homicidal Thoughts: No   Sensorium  Memory: Recent Poor  Judgment: Impaired  Insight: Lacking   Executive Functions  Concentration: Poor  Attention Span: Poor  Recall: Poor  Fund of Knowledge: Poor  Language: Fair   Psychomotor Activity  Psychomotor Activity: Psychomotor Activity: Restlessness   Assets  Assets: Housing; Social  Support    Sleep  Sleep: Sleep: Poor   Physical Exam: Physical Exam Constitutional:      Appearance: Normal appearance.  HENT:     Head: Normocephalic.     Nose: Nose normal.  Cardiovascular:     Rate and Rhythm: Normal rate.  Pulmonary:     Effort: Pulmonary effort is normal.  Musculoskeletal:        General: Normal range of motion.     Cervical back: Normal range of motion.  Skin:    General: Skin is dry.  Neurological:     Mental Status: She is alert.  Psychiatric:        Attention and Perception: She is inattentive. She perceives auditory hallucinations.        Mood and Affect: Mood is anxious.        Speech: Speech is delayed.        Behavior: Behavior is cooperative.  Thought Content: Thought content is delusional.        Cognition and Memory: Cognition is impaired.        Judgment: Judgment is inappropriate.    Review of Systems  Psychiatric/Behavioral:  Positive for hallucinations. The patient is nervous/anxious and has insomnia.   All other systems reviewed and are negative.  Blood pressure (!) 137/108, pulse (!) 106, temperature 98.3 F (36.8 C), temperature source Oral, resp. rate 19, weight 109 kg, SpO2 97 %. Body mass index is 35.49 kg/m.  Medical Decision Making: Female patient continues to have difficulty sleeping and is experiencing auditory  hallucinations and delusions.  Recommend inpatient hospitalization for stabilization of psychiatric condition and medication management.      Problem 1: Schizophrenia spectrum disorder with psychotic disorder type not yet determined     Problem 2: Insomnia   Disposition:  Recommend inpatient hospitalization for medication management, psychiatric treatment and treatment of insomnia.    Thomes Lolling, NP 12/07/2022 12:34 PM

## 2022-12-07 NOTE — BH Assessment (Signed)
TTS clinician attempted to complete TTS assessment. Per [place], RN, telepsych cart not working properly. Morrie Sheldon, NT, urgent request put in with IT and will message TTS when machine is fixed.

## 2022-12-07 NOTE — ED Notes (Signed)
Pt's father at bedside for visit. Pt calm and cooperative at this time.

## 2022-12-07 NOTE — ED Notes (Signed)
Pt began to get more restless and agitated. Pt attempting to stand on the bed, cover her head in blankets, and repeatedly close door. Pt then began to get aggressive and violent, attempting to run out of door screaming incoherently. MD at bedside. IVC placed. Medications and restraints ordered.

## 2022-12-07 NOTE — ED Notes (Signed)
Patient continues to be restless, guarded, disorganized.

## 2022-12-07 NOTE — ED Provider Notes (Addendum)
Patient aggressive, belligerent, agitated, screaming at staff, attempting to run at the door screaming incoherently.  She is chemically and physically restrained for patient and staff safety. IVC paperwork to be completed by Dr. Lockie Mola.   .Critical Care  Performed by: Glynn Octave, MD Authorized by: Glynn Octave, MD   Critical care provider statement:    Critical care time (minutes):  30   Critical care time was exclusive of:  Separately billable procedures and treating other patients   Critical care was necessary to treat or prevent imminent or life-threatening deterioration of the following conditions: psychiatric emergency.   Critical care was time spent personally by me on the following activities:  Development of treatment plan with patient or surrogate, discussions with consultants, evaluation of patient's response to treatment, examination of patient, ordering and review of laboratory studies, ordering and review of radiographic studies, ordering and performing treatments and interventions, pulse oximetry, re-evaluation of patient's condition, review of old charts, blood draw for specimens and obtaining history from patient or surrogate   I assumed direction of critical care for this patient from another provider in my specialty: no     Care discussed with: admitting provider       Glynn Octave, MD 12/07/22 2059    Glynn Octave, MD 12/07/22 2100

## 2022-12-07 NOTE — Progress Notes (Signed)
LCSW Progress Note  [id number]   Kara Nguyen  12/07/2022  3:18 PM  Description:   Inpatient Psychiatric Referral  Patient was recommended inpatient per Phebe Colla, NP. There are no available beds at [facility], per [facility], RN. Patient was referred to the following out of network facilities:   [facility] Provider Address Phone Fax  [facility]  [address], [place] [address] [phone]  [facility]  [address] [place] [address] [phone]  [facility] [address]  [address] [place] [address] [phone]  [facility]  [address] [place], [place] [address] [phone]  [facility] [address] [place]  [address], [place] [address] [phone]  [facility]  [address] [place] [address] [phone]  [facility]  [address] [place] [address] [phone]  [facility]  [address] [place] [address] [phone]  [facility]  [address] [place], [place] [address] [phone]  [facility]  [address] [place] [address] [phone]  [facility]  [address] [place] [address] [phone]  [facility]  [address] [place] [address] [phone]  [facility]  [address] [place] [address] [phone]  [facility]  [address], [place] [address] [phone]  [facility]  [address], [place] [address] [phone]  [facility]  [address] [place]  [address] [phone]  [facility]  [address] [place] [address] [phone]  [facility]  [address] [place] [address] [phone]  [facility]  [address], [place] [address] [phone]  [facility]  [address] [place] [address] [phone]  [facility]  [address] [place], [place] [address] [phone]  [facility]  [address], [place] [address] [phone]  [facility]  [address], [place] [address] [phone]  [facility]  [address] [place] [address] [phone]  [facility]  [address], [place] [address] [phone]  [facility] [place] [facility]  [address], [place] [address] [phone]    Situation ongoing, CSW to continue following and update chart as more information becomes available.      Cathie Beams, [place]  12/07/2022 3:18 PM

## 2022-12-08 ENCOUNTER — Emergency Department (HOSPITAL_COMMUNITY): Payer: Medicaid Other

## 2022-12-08 DIAGNOSIS — F29 Unspecified psychosis not due to a substance or known physiological condition: Secondary | ICD-10-CM | POA: Diagnosis not present

## 2022-12-08 DIAGNOSIS — N390 Urinary tract infection, site not specified: Secondary | ICD-10-CM | POA: Diagnosis present

## 2022-12-08 LAB — CBC WITH DIFFERENTIAL/PLATELET
Abs Immature Granulocytes: 0.08 10*3/uL — ABNORMAL HIGH (ref 0.00–0.07)
Basophils Absolute: 0.1 10*3/uL (ref 0.0–0.1)
Basophils Relative: 0 %
Eosinophils Absolute: 0 10*3/uL (ref 0.0–0.5)
Eosinophils Relative: 0 %
HCT: 36.2 % (ref 36.0–46.0)
Hemoglobin: 11.9 g/dL — ABNORMAL LOW (ref 12.0–15.0)
Immature Granulocytes: 1 %
Lymphocytes Relative: 10 %
Lymphs Abs: 1.7 10*3/uL (ref 0.7–4.0)
MCH: 28.3 pg (ref 26.0–34.0)
MCHC: 32.9 g/dL (ref 30.0–36.0)
MCV: 86 fL (ref 80.0–100.0)
Monocytes Absolute: 1 10*3/uL (ref 0.1–1.0)
Monocytes Relative: 6 %
Neutro Abs: 14.7 10*3/uL — ABNORMAL HIGH (ref 1.7–7.7)
Neutrophils Relative %: 83 %
Platelets: 443 10*3/uL — ABNORMAL HIGH (ref 150–400)
RBC: 4.21 MIL/uL (ref 3.87–5.11)
RDW: 13.1 % (ref 11.5–15.5)
WBC: 17.6 10*3/uL — ABNORMAL HIGH (ref 4.0–10.5)
nRBC: 0 % (ref 0.0–0.2)

## 2022-12-08 LAB — URINALYSIS, W/ REFLEX TO CULTURE (INFECTION SUSPECTED)
Bacteria, UA: NONE SEEN
Bilirubin Urine: NEGATIVE
Glucose, UA: NEGATIVE mg/dL
Ketones, ur: 5 mg/dL — AB
Nitrite: NEGATIVE
Protein, ur: 100 mg/dL — AB
RBC / HPF: >50 RBC/hpf (ref 0–5)
Specific Gravity, Urine: 1.032 — ABNORMAL HIGH (ref 1.005–1.030)
pH: 5 (ref 5.0–8.0)

## 2022-12-08 LAB — COMPREHENSIVE METABOLIC PANEL
ALT: 24 U/L (ref 0–44)
AST: 24 U/L (ref 15–41)
Albumin: 4.3 g/dL (ref 3.5–5.0)
Alkaline Phosphatase: 76 U/L (ref 38–126)
Anion gap: 12 (ref 5–15)
BUN: 16 mg/dL (ref 6–20)
CO2: 20 mmol/L — ABNORMAL LOW (ref 22–32)
Calcium: 9.1 mg/dL (ref 8.9–10.3)
Chloride: 104 mmol/L (ref 98–111)
Creatinine, Ser: 0.8 mg/dL (ref 0.44–1.00)
GFR, Estimated: >60 mL/min (ref >60)
Glucose, Bld: 108 mg/dL — ABNORMAL HIGH (ref 70–99)
Potassium: 3.9 mmol/L (ref 3.5–5.1)
Sodium: 136 mmol/L (ref 135–145)
Total Bilirubin: 0.6 mg/dL (ref 0.3–1.2)
Total Protein: 8.4 g/dL — ABNORMAL HIGH (ref 6.5–8.1)

## 2022-12-08 LAB — SARS CORONAVIRUS 2 BY RT PCR: SARS Coronavirus 2 by RT PCR: NEGATIVE

## 2022-12-08 LAB — LACTIC ACID, PLASMA: Lactic Acid, Venous: 0.7 mmol/L (ref 0.5–1.9)

## 2022-12-08 MED ORDER — TRAZODONE HCL 100 MG PO TABS: 100.0000 mg | ORAL_TABLET | Freq: Every evening | ORAL | Status: DC | PRN

## 2022-12-08 MED ORDER — ENOXAPARIN SODIUM 60 MG/0.6ML IJ SOSY
50.0000 mg | PREFILLED_SYRINGE | INTRAMUSCULAR | Status: DC
  Administered 2022-12-08: 50 mg via SUBCUTANEOUS
  Filled 2022-12-08 (×2): qty 0.6

## 2022-12-08 MED ORDER — SODIUM CHLORIDE 0.9 % IV SOLN
1.0000 g | INTRAVENOUS | Status: DC
  Administered 2022-12-09: 1 g via INTRAVENOUS
  Filled 2022-12-08: qty 10

## 2022-12-08 MED ORDER — ONDANSETRON HCL 4 MG PO TABS: 4.0000 mg | ORAL_TABLET | Freq: Four times a day (QID) | ORAL | Status: DC | PRN

## 2022-12-08 MED ORDER — HYDROXYZINE HCL 25 MG PO TABS: 25.0000 mg | ORAL_TABLET | Freq: Three times a day (TID) | ORAL | Status: DC | PRN

## 2022-12-08 MED ORDER — ONDANSETRON HCL 4 MG/2ML IJ SOLN: 4.0000 mg | Freq: Four times a day (QID) | INTRAMUSCULAR | Status: DC | PRN

## 2022-12-08 MED ORDER — ALBUTEROL SULFATE (2.5 MG/3ML) 0.083% IN NEBU: 2.5000 mg | INHALATION_SOLUTION | RESPIRATORY_TRACT | Status: DC | PRN

## 2022-12-08 MED ORDER — ACETAMINOPHEN 500 MG PO TABS
1000.0000 mg | ORAL_TABLET | Freq: Once | ORAL | Status: AC
  Administered 2022-12-08: 1000 mg via ORAL
  Filled 2022-12-08: qty 2

## 2022-12-08 MED ORDER — SODIUM CHLORIDE 0.9 % IV SOLN
1.0000 g | Freq: Once | INTRAVENOUS | Status: AC
  Administered 2022-12-08: 1 g via INTRAVENOUS
  Filled 2022-12-08: qty 10

## 2022-12-08 MED ORDER — ACETAMINOPHEN 650 MG RE SUPP: 650.0000 mg | Freq: Four times a day (QID) | RECTAL | Status: DC | PRN

## 2022-12-08 MED ORDER — ACETAMINOPHEN 325 MG PO TABS: 650.0000 mg | ORAL_TABLET | Freq: Four times a day (QID) | ORAL | Status: DC | PRN

## 2022-12-08 MED ORDER — SODIUM CHLORIDE 0.9 % IV BOLUS
1000.0000 mL | Freq: Once | INTRAVENOUS | Status: AC
  Administered 2022-12-08: 1000 mL via INTRAVENOUS

## 2022-12-08 NOTE — Progress Notes (Signed)
Pt was accepted to [facility] TODAY 12/08/2022. Bed assignment: Main campus  Pt meets inpatient criteria per Dahlia Byes, NP  Attending Physician will be Loni Beckwith, MD  Report can be called to: [phone] (this is a pager, please leave call-back number when giving report)  Pt can arrive after 8 AM  Care Team Notified: Dahlia Byes, NP, Rona Ravens, RN, Phebe Colla, NP, and April Wilson, Paramedic  [place], [place]  12/08/2022 9:55 AM

## 2022-12-08 NOTE — ED Notes (Signed)
Spoke with Lyla Son at [facility] regarding if patient would be coming to them. Informed them that she was not technically medically cleared at this time as she has some worsening labs and fever. Plan to admit to inpatient. Lyla Son stated once patient was medically cleared to fax paperwork back to [facility] and they would try to get her a bed.

## 2022-12-08 NOTE — ED Notes (Signed)
SBAR sent and nurse notified via secure chat.

## 2022-12-08 NOTE — ED Provider Notes (Addendum)
Emergency Medicine Observation Re-evaluation Note  Kara Nguyen is a 22 y.o. female, seen on rounds today.  Pt initially presented to the ED for complaints of Insomnia Currently, the patient is repetitively folding her bed sheet over and over again. She has a temp of 100.2.  When asked, she denies all complaints including no headache, cough, shortness of breath, urinary symptoms, abdominal pain, etc.  She declines an exam.  Physical Exam  BP (!) 142/99 (BP Location: Right Arm)   Pulse (!) 127   Temp 100.2 F (37.9 C) (Oral)   Resp 18   Wt 109 kg   SpO2 96%   BMI 35.49 kg/m  Physical Exam General: No acute distress.  Standing next to her bed repeatedly folding her bedsheet Cardiac: Not assessed, patient declined exam Lungs: Normal effort Psych: Psychotic  ED Course / MDM  EKG:EKG Interpretation  Date/Time:  Sunday Dec 06 2022 19:27:24 EDT Ventricular Rate:  106 PR Interval:  135 QRS Duration: 76 QT Interval:  325 QTC Calculation: 432 R Axis:   67 Text Interpretation: Sinus tachycardia Confirmed by Vivi Barrack [phone]) on 12/06/2022 8:47:43 PM  I have reviewed the labs performed to date as well as medications administered while in observation.  Recent changes in the last 24 hours include multiple psychiatric meds within the last 24 hours.  Plan  Current plan is for admission to [facility].  However with her low-grade temperature the facility was requesting COVID test and urine test.  I think these are appropriate but will also repeat lab work.  She has no neck stiffness just by observing her.  Will add on a chest x-ray though no significant cough and no hypoxia on the vitals.  Will give a dose of Tylenol.    Pricilla Loveless, MD 12/08/22 1107  2:16 PM Patient remains tachycardic (126). She has a mildly worsening leukocytosis (14 to 17) and mildly worse bicarbonate. Urine doesn't have nitrites, but has ~50 WBC (and did so a few days ago). I think she likely has a febrile  UTI and has psychosis (from known psychiatric disease). However, she is not cleared medically due to this, so will give IV rocephin, fluids, and admit. Will get blood cultures and lactate.   3:34 PM Discussed with Dr. Andree Moro who will admit.    Pricilla Loveless, MD 12/08/22 1537

## 2022-12-08 NOTE — ED Notes (Signed)
Assumed care of patient. Patient resting quietly in bed with sheet over her head. Allowed this RN to stick for blood cultures and administer fluids. Patient tolerated well. Sitter at bedside.

## 2022-12-08 NOTE — H&P (Signed)
History and Physical  Kara Nguyen ZOX:[id number] DOB: December 26, 2000 DOA: 12/06/2022  PCP: Jordan Hawks, PA-C   Chief Complaint: no sleep   HPI: Kara Nguyen is a 22 y.o. female with medical history significant for bipolar disorder who has been in the ER on IVC since the early morning hours of 5/26 when she presented with insomnia few days, asking to speak to a psychiatrist.  She has been monitored closely in the emergency department, and stable awaiting inpatient psych placement.  However, over the last 48 hours she has gradually developed acute cardiac, low-grade fever.  Urinalysis done today shows evidence of urinary tract infection.  Patient was therefore deemed not medically cleared for inpatient psych, she was given IV fluids, empiric IV Rocephin, and hospitalist contacted for admission.  Review of Systems: Please see HPI for pertinent positives and negatives. A complete 10 system review of systems not be performed due to the patient's mental status, however she did specifically deny dysuria, or fevers.  Past Medical History:  Diagnosis Date   Anxiety    Bipolar disorder (HCC)    Deliberate self-cutting    Depression    History reviewed. No pertinent surgical history.  Social History:  reports that she has never smoked. She has never used smokeless tobacco. She reports that she does not currently use drugs after having used the following drugs: Solvent inhalants. She reports that she does not drink alcohol.   Allergies  Allergen Reactions   Amoxicillin Hives and Itching    Family History  Problem Relation Age of Onset   Cancer Other    Diabetes Paternal Grandmother      Prior to Admission medications   Medication Sig Start Date End Date Taking? Authorizing Provider  hydrOXYzine (ATARAX) 25 MG tablet Take 1 tablet (25 mg total) by mouth 3 (three) times daily as needed for anxiety. 11/09/22  Yes Massengill, Harrold Donath, MD  risperiDONE (RISPERDAL) 0.5 MG tablet Take 3  tablets (1.5 mg total) by mouth at bedtime. 11/09/22 12/09/22 Yes Massengill, Harrold Donath, MD  traZODone (DESYREL) 100 MG tablet Take 1 tablet (100 mg total) by mouth at bedtime as needed for sleep. 11/09/22  Yes Massengill, Harrold Donath, MD    Physical Exam: BP (!) 140/84   Pulse 97   Temp 99.9 F (37.7 C) (Oral)   Resp (!) 9   Wt 109 kg   SpO2 100%   BMI 35.49 kg/m   General: Patient is alert, very flat affect, but pleasant.  Allows exam. Eyes: EOMI, clear conjuctivae, white sclerea Neck: supple, no masses, trachea mildline  Cardiovascular: RRR, no murmurs or rubs, no peripheral edema  Respiratory: clear to auscultation bilaterally, no wheezes, no crackles  Abdomen: soft, nontender, nondistended, normal bowel tones heard  Skin: dry, no rashes  Musculoskeletal: no joint effusions, normal range of motion  Psychiatric: Flat affect, staring straight forward at the wall, normal speech  Neurologic: extraocular muscles intact, clear speech, moving all extremities with intact sensorium          Labs on Admission:  Basic Metabolic Panel: Recent Labs  Lab 12/06/22 2006 12/08/22 1103  NA 137 136  K 3.9 3.9  CL 106 104  CO2 22 20*  GLUCOSE 104* 108*  BUN 10 16  CREATININE 0.73 0.80  CALCIUM 8.9 9.1   Liver Function Tests: Recent Labs  Lab 12/06/22 2006 12/08/22 1103  AST 16 24  ALT 18 24  ALKPHOS 73 76  BILITOT 0.5 0.6  PROT 7.9 8.4*  ALBUMIN 4.0 4.3  No results for input(s): "LIPASE", "AMYLASE" in the last 168 hours. No results for input(s): "AMMONIA" in the last 168 hours. CBC: Recent Labs  Lab 12/06/22 2006 12/08/22 1103  WBC 14.8* 17.6*  NEUTROABS 10.9* 14.7*  HGB 11.2* 11.9*  HCT 34.2* 36.2  MCV 85.7 86.0  PLT 388 443*   Cardiac Enzymes: No results for input(s): "CKTOTAL", "CKMB", "CKMBINDEX", "TROPONINI" in the last 168 hours.  BNP (last 3 results) No results for input(s): "BNP" in the last 8760 hours.  ProBNP (last 3 results) No results for input(s):  "PROBNP" in the last 8760 hours.  CBG: No results for input(s): "GLUCAP" in the last 168 hours.  Radiological Exams on Admission: DG Chest 2 View  Result Date: 12/08/2022 CLINICAL DATA:  Fever EXAM: CHEST - 2 VIEW COMPARISON:  10/30/2022 FINDINGS: Cardiomediastinal silhouette and pulmonary vasculature are within normal limits. Lungs are clear. IMPRESSION: No acute cardiopulmonary process. Electronically Signed   By: Acquanetta Belling M.D.   On: 12/08/2022 11:27    Assessment/Plan This is an otherwise healthy 22 year old female with a history of bipolar disorder who is on IVC awaiting inpatient psych placement, found to have developed urinary tract infection.  UTI-not septic, hemodynamically stable, normal lactate. -Observation admission -Regular diet -Empiric IV Rocephin  Bipolar disorder-Risperdal nightly, continue IVC  Insomnia-trazodone as needed  DVT prophylaxis: Lovenox     Code Status: Full Code  Consults called: None  Admission status: Observation  Time spent: 45 minutes  Caterina Racine Sharlette Dense MD [facility] Pager [phone]  If 7PM-7AM, please contact night-coverage [email or web address] Password [facility]  12/08/2022, 4:30 PM

## 2022-12-08 NOTE — Consult Note (Signed)
Patient is unable to accept the bed offer from [facility] at this time due to change in care.  Patient is being worked up for Medical condition and requires more blood work and consultation with Hospitalist group.Psychiatry admission will be on hold until Medically cleared.

## 2022-12-08 NOTE — ED Notes (Signed)
ED TO INPATIENT HANDOFF REPORT  Name/Age/Gender Kara Nguyen 22 y.o. female  Code Status    Code Status Orders  (From admission, onward)           Start     Ordered   12/08/22 1610  Full code  Continuous       Question:  By:  Answer:  Consent: discussion documented in EHR   12/08/22 1610           Code Status History     Date Active Date Inactive Code Status Order ID Comments User Context   11/03/2022 1231 11/09/2022 1744 Full Code [id number]  Mancel Bale, NP Inpatient   11/06/2019 1027 11/10/2019 2021 Full Code [id number]  Gaylyn Lambert, MD Inpatient   01/09/2018 1216 01/17/2018 1535 Full Code [id number]  Money, Gerlene Burdock, FNP Inpatient       Home/SNF/Other Home  Chief Complaint UTI (urinary tract infection) [N39.0]  Level of Care/Admitting Diagnosis ED Disposition     ED Disposition  Admit   Condition  --   Comment  Hospital Area: [facility] [[id number]]  Level of Care: Telemetry [5]  Admit to tele based on following criteria: Complex arrhythmia (Bradycardia/Tachycardia)  May place patient in observation at [facility] or [facility] if equivalent level of care is available:: Yes  Covid Evaluation: Asymptomatic - no recent exposure (last 10 days) testing not required  Diagnosis: UTI (urinary tract infection) [629528]  Admitting Physician: Maryln Gottron [[id number]]  Attending Physician: Olexa.Dam, MIR Jaxson.Roy [[id number]]          Medical History Past Medical History:  Diagnosis Date   Anxiety    Bipolar disorder (HCC)    Deliberate self-cutting    Depression     Allergies Allergies  Allergen Reactions   Amoxicillin Hives and Itching    IV Location/Drains/Wounds Patient Lines/Drains/Airways Status     Active Line/Drains/Airways     Name Placement date Placement time Site Days   Peripheral IV 12/08/22 20 G Right Antecubital 12/08/22  1453  Antecubital  less than 1   Peripheral IV 12/08/22 20 G Anterior;Left  Forearm 12/08/22  1459  Forearm  less than 1            Labs/Imaging Results for orders placed or performed during the hospital encounter of 12/06/22 (from the past 48 hour(s))  Rapid urine drug screen (hospital performed)     Status: None   Collection Time: 12/06/22  7:12 PM  Result Value Ref Range   Opiates NONE DETECTED NONE DETECTED   Cocaine NONE DETECTED NONE DETECTED   Benzodiazepines NONE DETECTED NONE DETECTED   Amphetamines NONE DETECTED NONE DETECTED   Tetrahydrocannabinol NONE DETECTED NONE DETECTED   Barbiturates NONE DETECTED NONE DETECTED    Comment: (NOTE) DRUG SCREEN FOR MEDICAL PURPOSES ONLY.  IF CONFIRMATION IS NEEDED FOR ANY PURPOSE, NOTIFY LAB WITHIN 5 DAYS.  LOWEST DETECTABLE LIMITS FOR URINE DRUG SCREEN Drug Class                     Cutoff (ng/mL) Amphetamine and metabolites    1000 Barbiturate and metabolites    200 Benzodiazepine                 200 Opiates and metabolites        300 Cocaine and metabolites        300 THC  50 Performed at [facility], [address], [place], [place] [address]   Urinalysis, Routine w reflex microscopic -Urine, Clean Catch     Status: Abnormal   Collection Time: 12/06/22  7:12 PM  Result Value Ref Range   Color, Urine RED (A) YELLOW   APPearance HAZY (A) CLEAR   Specific Gravity, Urine 1.012 1.005 - 1.030   pH 7.0 5.0 - 8.0   Glucose, UA NEGATIVE NEGATIVE mg/dL   Hgb urine dipstick LARGE (A) NEGATIVE   Bilirubin Urine NEGATIVE NEGATIVE   Ketones, ur NEGATIVE NEGATIVE mg/dL   Protein, ur 604 (A) NEGATIVE mg/dL   Nitrite NEGATIVE NEGATIVE   Leukocytes,Ua SMALL (A) NEGATIVE   RBC / HPF >50 0 - 5 RBC/hpf   WBC, UA >50 0 - 5 WBC/hpf   Bacteria, UA NONE SEEN NONE SEEN   Squamous Epithelial / HPF 0-5 0 - 5 /HPF   WBC Clumps PRESENT     Comment: Performed at [facility], [address] [place], [place] [address]  Pregnancy, urine     Status:  None   Collection Time: 12/06/22  7:12 PM  Result Value Ref Range   Preg Test, Ur NEGATIVE NEGATIVE    Comment:        THE SENSITIVITY OF THIS METHODOLOGY IS >20 mIU/mL. Performed at [facility], [address], [place], [place] [address]   CBC with Differential     Status: Abnormal   Collection Time: 12/06/22  8:06 PM  Result Value Ref Range   WBC 14.8 (H) 4.0 - 10.5 K/uL   RBC 3.99 3.87 - 5.11 MIL/uL   Hemoglobin 11.2 (L) 12.0 - 15.0 g/dL   HCT 78.2 (L) 95.6 - 21.3 %   MCV 85.7 80.0 - 100.0 fL   MCH 28.1 26.0 - 34.0 pg   MCHC 32.7 30.0 - 36.0 g/dL   RDW 08.6 57.8 - 46.9 %   Platelets 388 150 - 400 K/uL   nRBC 0.0 0.0 - 0.2 %   Neutrophils Relative % 74 %   Neutro Abs 10.9 (H) 1.7 - 7.7 K/uL   Lymphocytes Relative 19 %   Lymphs Abs 2.8 0.7 - 4.0 K/uL   Monocytes Relative 6 %   Monocytes Absolute 0.9 0.1 - 1.0 K/uL   Eosinophils Relative 1 %   Eosinophils Absolute 0.1 0.0 - 0.5 K/uL   Basophils Relative 0 %   Basophils Absolute 0.0 0.0 - 0.1 K/uL   Immature Granulocytes 0 %   Abs Immature Granulocytes 0.04 0.00 - 0.07 K/uL    Comment: Performed at [facility], [address] [place], [place] [address]  Comprehensive metabolic panel     Status: Abnormal   Collection Time: 12/06/22  8:06 PM  Result Value Ref Range   Sodium 137 135 - 145 mmol/L   Potassium 3.9 3.5 - 5.1 mmol/L   Chloride 106 98 - 111 mmol/L   CO2 22 22 - 32 mmol/L   Glucose, Bld 104 (H) 70 - 99 mg/dL    Comment: Glucose reference range applies only to samples taken after fasting for at least 8 hours.   BUN 10 6 - 20 mg/dL   Creatinine, Ser 8.41 0.44 - 1.00 mg/dL   Calcium 8.9 8.9 - 32.4 mg/dL   Total Protein 7.9 6.5 - 8.1 g/dL   Albumin 4.0 3.5 - 5.0 g/dL   AST 16 15 - 41 U/L   ALT 18 0 - 44  U/L   Alkaline Phosphatase 73 38 - 126 U/L   Total Bilirubin 0.5 0.3 - 1.2 mg/dL   GFR, Estimated >21 >30 mL/min    Comment: (NOTE) Calculated using the CKD-EPI  Creatinine Equation (2021)    Anion gap 9 5 - 15    Comment: Performed at [facility], [address], [place], [place] [address]  Ethanol     Status: None   Collection Time: 12/06/22  8:06 PM  Result Value Ref Range   Alcohol, Ethyl (B) <10 <10 mg/dL    Comment: (NOTE) Lowest detectable limit for serum alcohol is 10 mg/dL.  For medical purposes only. Performed at [facility], [address] [place], [place] [address]   SARS Coronavirus 2 by RT PCR (hospital order, performed in [facility] hospital lab) *cepheid single result test* Anterior Nasal Swab     Status: None   Collection Time: 12/08/22 10:53 AM   Specimen: Anterior Nasal Swab  Result Value Ref Range   SARS Coronavirus 2 by RT PCR NEGATIVE NEGATIVE    Comment: (NOTE) SARS-CoV-2 target nucleic acids are NOT DETECTED.  The SARS-CoV-2 RNA is generally detectable in upper and lower respiratory specimens during the acute phase of infection. The lowest concentration of SARS-CoV-2 viral copies this assay can detect is 250 copies / mL. A negative result does not preclude SARS-CoV-2 infection and should not be used as the sole basis for treatment or other patient management decisions.  A negative result may occur with improper specimen collection / handling, submission of specimen other than nasopharyngeal swab, presence of viral mutation(s) within the areas targeted by this assay, and inadequate number of viral copies (<250 copies / mL). A negative result must be combined with clinical observations, patient history, and epidemiological information.  Fact Sheet for Patients:   RoadLapTop.co.za  Fact Sheet for Healthcare Providers: http://kim-miller.com/  This test is not yet approved or  cleared by the [place] FDA and has been authorized for detection and/or diagnosis of SARS-CoV-2 by FDA under an Emergency Use Authorization (EUA).   This EUA will remain in effect (meaning this test can be used) for the duration of the COVID-19 declaration under Section 564(b)(1) of the Act, 21 U.S.C. section 360bbb-3(b)(1), unless the authorization is terminated or revoked sooner.  Performed at [facility], [address], [place], [place] [address]   Comprehensive metabolic panel     Status: Abnormal   Collection Time: 12/08/22 11:03 AM  Result Value Ref Range   Sodium 136 135 - 145 mmol/L   Potassium 3.9 3.5 - 5.1 mmol/L   Chloride 104 98 - 111 mmol/L   CO2 20 (L) 22 - 32 mmol/L   Glucose, Bld 108 (H) 70 - 99 mg/dL    Comment: Glucose reference range applies only to samples taken after fasting for at least 8 hours.   BUN 16 6 - 20 mg/dL   Creatinine, Ser 1.32 0.44 - 1.00 mg/dL   Calcium 9.1 8.9 - 44.0 mg/dL   Total Protein 8.4 (H) 6.5 - 8.1 g/dL   Albumin 4.3 3.5 - 5.0 g/dL   AST 24 15 - 41 U/L   ALT 24 0 - 44 U/L   Alkaline Phosphatase 76 38 - 126 U/L   Total Bilirubin 0.6 0.3 - 1.2 mg/dL   GFR, Estimated >10 >27 mL/min    Comment: (NOTE) Calculated using the CKD-EPI Creatinine Equation (2021)    Anion gap 12 5 - 15  Comment: Performed at [facility], [address], [place], [place] [address]  CBC with Differential     Status: Abnormal   Collection Time: 12/08/22 11:03 AM  Result Value Ref Range   WBC 17.6 (H) 4.0 - 10.5 K/uL   RBC 4.21 3.87 - 5.11 MIL/uL   Hemoglobin 11.9 (L) 12.0 - 15.0 g/dL   HCT 60.4 54.0 - 98.1 %   MCV 86.0 80.0 - 100.0 fL   MCH 28.3 26.0 - 34.0 pg   MCHC 32.9 30.0 - 36.0 g/dL   RDW 19.1 47.8 - 29.5 %   Platelets 443 (H) 150 - 400 K/uL   nRBC 0.0 0.0 - 0.2 %   Neutrophils Relative % 83 %   Neutro Abs 14.7 (H) 1.7 - 7.7 K/uL   Lymphocytes Relative 10 %   Lymphs Abs 1.7 0.7 - 4.0 K/uL   Monocytes Relative 6 %   Monocytes Absolute 1.0 0.1 - 1.0 K/uL   Eosinophils Relative 0 %   Eosinophils Absolute 0.0 0.0 - 0.5 K/uL   Basophils Relative 0 %    Basophils Absolute 0.1 0.0 - 0.1 K/uL   Immature Granulocytes 1 %   Abs Immature Granulocytes 0.08 (H) 0.00 - 0.07 K/uL    Comment: Performed at [facility], [address], [place], [place] [address]  Urinalysis, w/ Reflex to Culture (Infection Suspected) -Urine, Clean Catch     Status: Abnormal   Collection Time: 12/08/22  1:30 PM  Result Value Ref Range   Specimen Source URINE, CLEAN CATCH    Color, Urine YELLOW YELLOW   APPearance CLOUDY (A) CLEAR   Specific Gravity, Urine 1.032 (H) 1.005 - 1.030   pH 5.0 5.0 - 8.0   Glucose, UA NEGATIVE NEGATIVE mg/dL   Hgb urine dipstick LARGE (A) NEGATIVE   Bilirubin Urine NEGATIVE NEGATIVE   Ketones, ur 5 (A) NEGATIVE mg/dL   Protein, ur 865 (A) NEGATIVE mg/dL   Nitrite NEGATIVE NEGATIVE   Leukocytes,Ua SMALL (A) NEGATIVE   RBC / HPF >50 0 - 5 RBC/hpf   WBC, UA 21-50 0 - 5 WBC/hpf    Comment:        Reflex urine culture not performed if WBC <=10, OR if Squamous epithelial cells >5. If Squamous epithelial cells >5 suggest recollection.    Bacteria, UA NONE SEEN NONE SEEN   Squamous Epithelial / HPF 0-5 0 - 5 /HPF   Mucus PRESENT     Comment: Performed at [facility], [address], [place], [place] [address]  Lactic acid, plasma     Status: None   Collection Time: 12/08/22  2:45 PM  Result Value Ref Range   Lactic Acid, Venous 0.7 0.5 - 1.9 mmol/L    Comment: Performed at [facility], [address], [place], [place] [address]   DG Chest 2 View  Result Date: 12/08/2022 CLINICAL DATA:  Fever EXAM: CHEST - 2 VIEW COMPARISON:  10/30/2022 FINDINGS: Cardiomediastinal silhouette and pulmonary vasculature are within normal limits. Lungs are clear. IMPRESSION: No acute cardiopulmonary process. Electronically Signed   By: Acquanetta Belling M.D.   On: 12/08/2022 11:27    Pending Labs Unresulted Labs (From admission, onward)     Start     Ordered   12/09/22 0500  Basic metabolic panel   Tomorrow morning,   R        12/08/22 1610   12/09/22 0500  CBC  Tomorrow morning,   R  12/08/22 1610   12/08/22 1343  Blood culture (routine x 2)  BLOOD CULTURE X 2,   R (with STAT occurrences)      12/08/22 1415   12/08/22 1330  Urine Culture  Once,   R        12/08/22 1330            Vitals/Pain Today's Vitals   12/08/22 1530 12/08/22 1545 12/08/22 1600 12/08/22 1615  BP: 129/77 132/71 137/85 (!) 140/84  Pulse:  (!) 102 (!) 109 97  Resp: 17 12 11  (!) 9  Temp:      TempSrc:      SpO2:  100% 100% 100%  Weight:      PainSc:        Isolation Precautions Airborne and Contact precautions  Medications Medications  traZODone (DESYREL) tablet 100 mg (100 mg Oral Not Given 12/07/22 2135)  risperiDONE (RISPERDAL) tablet 1.5 mg (1.5 mg Oral Not Given 12/07/22 2135)  cefTRIAXone (ROCEPHIN) 1 g in sodium chloride 0.9 % 100 mL IVPB (has no administration in time range)  hydrOXYzine (ATARAX) tablet 25 mg (has no administration in time range)  traZODone (DESYREL) tablet 100 mg (has no administration in time range)  enoxaparin (LOVENOX) injection 40 mg (has no administration in time range)  acetaminophen (TYLENOL) tablet 650 mg (has no administration in time range)    Or  acetaminophen (TYLENOL) suppository 650 mg (has no administration in time range)  ondansetron (ZOFRAN) tablet 4 mg (has no administration in time range)    Or  ondansetron (ZOFRAN) injection 4 mg (has no administration in time range)  albuterol (PROVENTIL) (2.5 MG/3ML) 0.083% nebulizer solution 2.5 mg (has no administration in time range)  hydrOXYzine (ATARAX) tablet 50 mg (50 mg Oral Given 12/06/22 2052)  hydrOXYzine (ATARAX) tablet 25 mg (25 mg Oral Given 12/07/22 1026)  ziprasidone (GEODON) injection 20 mg (20 mg Intramuscular Given 12/07/22 2041)  LORazepam (ATIVAN) injection 2 mg (2 mg Intramuscular Given 12/07/22 2041)  diphenhydrAMINE (BENADRYL) injection 50 mg (50 mg Intramuscular Given 12/07/22 2043)   sterile water (preservative free) injection (1.2 mLs  Given 12/07/22 2043)  acetaminophen (TYLENOL) tablet 1,000 mg (1,000 mg Oral Given 12/08/22 1221)  sodium chloride 0.9 % bolus 1,000 mL (1,000 mLs Intravenous New Bag/Given 12/08/22 1457)  cefTRIAXone (ROCEPHIN) 1 g in sodium chloride 0.9 % 100 mL IVPB (0 g Intravenous Stopped 12/08/22 1556)    Mobility walks

## 2022-12-08 NOTE — Plan of Care (Signed)
  Problem: Safety: Goal: Violent Restraint(s) Outcome: Progressing   Problem: Education: Goal: Knowledge of General Education information will improve Description: Including pain rating scale, medication(s)/side effects and non-pharmacologic comfort measures Outcome: Progressing   Problem: Health Behavior/Discharge Planning: Goal: Ability to manage health-related needs will improve Outcome: Progressing   Problem: Clinical Measurements: Goal: Ability to maintain clinical measurements within normal limits will improve Outcome: Progressing Goal: Will remain free from infection Outcome: Progressing Goal: Diagnostic test results will improve Outcome: Progressing Goal: Respiratory complications will improve Outcome: Progressing Goal: Cardiovascular complication will be avoided Outcome: Progressing   Problem: Activity: Goal: Risk for activity intolerance will decrease Outcome: Progressing   Problem: Nutrition: Goal: Adequate nutrition will be maintained Outcome: Progressing   Problem: Coping: Goal: Level of anxiety will decrease Outcome: Progressing   Problem: Elimination: Goal: Will not experience complications related to bowel motility Outcome: Progressing Goal: Will not experience complications related to urinary retention Outcome: Progressing   Problem: Pain Managment: Goal: General experience of comfort will improve Outcome: Progressing   Problem: Safety: Goal: Ability to remain free from injury will improve Outcome: Progressing   Problem: Skin Integrity: Goal: Risk for impaired skin integrity will decrease Outcome: Progressing   

## 2022-12-09 ENCOUNTER — Encounter (HOSPITAL_COMMUNITY): Payer: Self-pay | Admitting: Psychiatry

## 2022-12-09 ENCOUNTER — Inpatient Hospital Stay (HOSPITAL_COMMUNITY)
Admission: AD | Admit: 2022-12-09 | Discharge: 2022-12-18 | DRG: 885 | Disposition: A | Discharge status: Home / Self Care | Payer: Medicaid Other | Source: Intra-hospital | Attending: Psychiatry | Admitting: Psychiatry

## 2022-12-09 ENCOUNTER — Other Ambulatory Visit: Payer: Self-pay

## 2022-12-09 DIAGNOSIS — R000 Tachycardia, unspecified: Secondary | ICD-10-CM | POA: Diagnosis present

## 2022-12-09 DIAGNOSIS — K30 Functional dyspepsia: Secondary | ICD-10-CM | POA: Diagnosis present

## 2022-12-09 DIAGNOSIS — Z79899 Other long term (current) drug therapy: Secondary | ICD-10-CM | POA: Diagnosis not present

## 2022-12-09 DIAGNOSIS — Z9152 Personal history of nonsuicidal self-harm: Secondary | ICD-10-CM

## 2022-12-09 DIAGNOSIS — F513 Sleepwalking [somnambulism]: Secondary | ICD-10-CM | POA: Diagnosis present

## 2022-12-09 DIAGNOSIS — N390 Urinary tract infection, site not specified: Secondary | ICD-10-CM | POA: Diagnosis present

## 2022-12-09 DIAGNOSIS — F28 Other psychotic disorder not due to a substance or known physiological condition: Secondary | ICD-10-CM | POA: Diagnosis not present

## 2022-12-09 DIAGNOSIS — G4700 Insomnia, unspecified: Secondary | ICD-10-CM | POA: Diagnosis present

## 2022-12-09 DIAGNOSIS — K5900 Constipation, unspecified: Secondary | ICD-10-CM | POA: Diagnosis present

## 2022-12-09 DIAGNOSIS — F419 Anxiety disorder, unspecified: Secondary | ICD-10-CM | POA: Diagnosis present

## 2022-12-09 DIAGNOSIS — F061 Catatonic disorder due to known physiological condition: Secondary | ICD-10-CM | POA: Diagnosis present

## 2022-12-09 DIAGNOSIS — F29 Unspecified psychosis not due to a substance or known physiological condition: Principal | ICD-10-CM | POA: Diagnosis present

## 2022-12-09 DIAGNOSIS — Z91199 Patient's noncompliance with other medical treatment and regimen due to unspecified reason: Secondary | ICD-10-CM

## 2022-12-09 DIAGNOSIS — R45850 Homicidal ideations: Secondary | ICD-10-CM | POA: Diagnosis present

## 2022-12-09 LAB — BASIC METABOLIC PANEL
Anion gap: 8 (ref 5–15)
BUN: 11 mg/dL (ref 6–20)
CO2: 22 mmol/L (ref 22–32)
Calcium: 8.8 mg/dL — ABNORMAL LOW (ref 8.9–10.3)
Chloride: 107 mmol/L (ref 98–111)
Creatinine, Ser: 0.56 mg/dL (ref 0.44–1.00)
GFR, Estimated: >60 mL/min (ref >60)
Glucose, Bld: 115 mg/dL — ABNORMAL HIGH (ref 70–99)
Potassium: 3.7 mmol/L (ref 3.5–5.1)
Sodium: 137 mmol/L (ref 135–145)

## 2022-12-09 LAB — URINE CULTURE

## 2022-12-09 LAB — CBC
HCT: 34.3 % — ABNORMAL LOW (ref 36.0–46.0)
Hemoglobin: 11.2 g/dL — ABNORMAL LOW (ref 12.0–15.0)
MCH: 28.1 pg (ref 26.0–34.0)
MCHC: 32.7 g/dL (ref 30.0–36.0)
MCV: 86.2 fL (ref 80.0–100.0)
Platelets: 352 10*3/uL (ref 150–400)
RBC: 3.98 MIL/uL (ref 3.87–5.11)
RDW: 13 % (ref 11.5–15.5)
WBC: 12.7 10*3/uL — ABNORMAL HIGH (ref 4.0–10.5)
nRBC: 0 % (ref 0.0–0.2)

## 2022-12-09 LAB — CULTURE, BLOOD (ROUTINE X 2)

## 2022-12-09 MED ORDER — ACETAMINOPHEN 325 MG PO TABS: 650.0000 mg | ORAL_TABLET | Freq: Four times a day (QID) | ORAL | Status: DC | PRN

## 2022-12-09 MED ORDER — DIPHENHYDRAMINE HCL 50 MG/ML IJ SOLN
50.0000 mg | Freq: Three times a day (TID) | INTRAMUSCULAR | Status: DC | PRN
  Administered 2022-12-10: 50 mg via INTRAMUSCULAR
  Filled 2022-12-09: qty 1

## 2022-12-09 MED ORDER — RISPERIDONE 1 MG PO TABS
1.5000 mg | ORAL_TABLET | Freq: Every day | ORAL | Status: DC
  Administered 2022-12-09: 1.5 mg via ORAL
  Filled 2022-12-09 (×5): qty 1

## 2022-12-09 MED ORDER — MAGNESIUM HYDROXIDE 400 MG/5ML PO SUSP: 30.0000 mL | Freq: Every day | ORAL | Status: DC | PRN

## 2022-12-09 MED ORDER — ALUM & MAG HYDROXIDE-SIMETH 200-200-20 MG/5ML PO SUSP: 30.0000 mL | ORAL | Status: DC | PRN

## 2022-12-09 MED ORDER — LORAZEPAM 1 MG PO TABS
2.0000 mg | ORAL_TABLET | Freq: Three times a day (TID) | ORAL | Status: DC | PRN
  Administered 2022-12-10 – 2022-12-13 (×3): 2 mg via ORAL
  Filled 2022-12-09 (×3): qty 2

## 2022-12-09 MED ORDER — SULFAMETHOXAZOLE-TRIMETHOPRIM 800-160 MG PO TABS
1.0000 | ORAL_TABLET | Freq: Two times a day (BID) | ORAL | Status: AC
  Administered 2022-12-09 – 2022-12-12 (×6): 1 via ORAL
  Filled 2022-12-09 (×7): qty 1

## 2022-12-09 MED ORDER — LORAZEPAM 2 MG/ML IJ SOLN
2.0000 mg | Freq: Three times a day (TID) | INTRAMUSCULAR | Status: DC | PRN
  Administered 2022-12-10: 2 mg via INTRAMUSCULAR
  Filled 2022-12-09: qty 1

## 2022-12-09 MED ORDER — HYDROXYZINE HCL 25 MG PO TABS
25.0000 mg | ORAL_TABLET | Freq: Three times a day (TID) | ORAL | Status: DC | PRN
  Administered 2022-12-10 – 2022-12-13 (×4): 25 mg via ORAL
  Filled 2022-12-09 (×4): qty 1

## 2022-12-09 MED ORDER — SULFAMETHOXAZOLE-TRIMETHOPRIM 800-160 MG PO TABS: 1.0000 | ORAL_TABLET | Freq: Two times a day (BID) | ORAL | 0 refills | Status: DC

## 2022-12-09 MED ORDER — TRAZODONE HCL 100 MG PO TABS
100.0000 mg | ORAL_TABLET | Freq: Every evening | ORAL | Status: DC | PRN
  Administered 2022-12-09 – 2022-12-17 (×14): 100 mg via ORAL
  Filled 2022-12-09 (×25): qty 1

## 2022-12-09 MED ORDER — SULFAMETHOXAZOLE-TRIMETHOPRIM 800-160 MG PO TABS
1.0000 | ORAL_TABLET | Freq: Two times a day (BID) | ORAL | Status: DC
  Filled 2022-12-09: qty 1

## 2022-12-09 MED ORDER — HALOPERIDOL 5 MG PO TABS
5.0000 mg | ORAL_TABLET | Freq: Three times a day (TID) | ORAL | Status: DC | PRN
  Administered 2022-12-10 (×2): 5 mg via ORAL
  Filled 2022-12-09 (×2): qty 1

## 2022-12-09 MED ORDER — HALOPERIDOL LACTATE 5 MG/ML IJ SOLN
5.0000 mg | Freq: Three times a day (TID) | INTRAMUSCULAR | Status: DC | PRN
  Administered 2022-12-10: 5 mg via INTRAMUSCULAR
  Filled 2022-12-09: qty 1

## 2022-12-09 MED ORDER — DIPHENHYDRAMINE HCL 25 MG PO CAPS
50.0000 mg | ORAL_CAPSULE | Freq: Three times a day (TID) | ORAL | Status: DC | PRN
  Administered 2022-12-10 (×2): 50 mg via ORAL
  Filled 2022-12-09 (×2): qty 2

## 2022-12-09 NOTE — Discharge Summary (Addendum)
Physician Discharge Summary  Gettie Sassone ZOX:[id number] DOB: January 09, 2001 DOA: 12/06/2022  PCP: Jordan Hawks, PA-C  Admit date: 12/06/2022 Discharge date: 12/09/2022  Admitted From: home Discharge disposition: [facility]   Recommendations for Outpatient Follow-Up:   Follow urine culture to final-- treating with bactrim (2 more days of bactrim-- through 5/30)   Discharge Diagnosis:   Principal Problem:   Schizophrenia spectrum disorder with psychotic disorder type not yet determined (HCC) Active Problems:   Insomnia   UTI (urinary tract infection)    Discharge Condition: Improved.  Diet recommendation: Regular.  Wound care: None.  Code status: Full.   History of Present Illness:   Kara Nguyen is a 22 y.o. female with medical history significant for bipolar disorder who has been in the ER on IVC since the early morning hours of 5/26 when she presented with insomnia few days, asking to speak to a psychiatrist.  She has been monitored closely in the emergency department, and stable awaiting inpatient psych placement.  However, over the last 48 hours she has gradually developed acute cardiac, low-grade fever.  Urinalysis done today shows evidence of urinary tract infection.  Patient was therefore deemed not medically cleared for inpatient psych, she was given IV fluids, empiric IV Rocephin, and hospitalist contacted for admission.    Hospital Course by Problem:   This is an otherwise healthy 22 year old female with a history of bipolar disorder who is on IVC awaiting inpatient psych placement, found to have developed urinary tract infection.   UTI-not septic, hemodynamically stable, normal lactate. -Regular diet -Empiric IV Rocephin changed to PO bactrim-- no prior cultures with any growth in past-- follow culture to final at [facility]   Bipolar disorder -Risperdal nightly -to [facility]   Insomnia -trazodone     Medical Consultants:   psych    Discharge  Exam:   Vitals:   12/09/22 0534 12/09/22 0734  BP: (!) 156/86 135/88  Pulse: (!) 118 95  Resp: 18 16  Temp: 98.7 F (37.1 C) 98.4 F (36.9 C)  SpO2: 100% 100%   Vitals:   12/08/22 2118 12/09/22 0119 12/09/22 0534 12/09/22 0734  BP: 133/74 (!) 144/84 (!) 156/86 135/88  Pulse: (!) 103 (!) 102 (!) 118 95  Resp: 16 16 18 16   Temp: 98.4 F (36.9 C) 98.8 F (37.1 C) 98.7 F (37.1 C) 98.4 F (36.9 C)  TempSrc: Oral Oral Oral Oral  SpO2: 100% 100% 100% 100%  Weight:        General exam: Appears calm and comfortable. Some pressured speech  The results of significant diagnostics from this hospitalization (including imaging, microbiology, ancillary and laboratory) are listed below for reference.     Procedures and Diagnostic Studies:   No results found.   Labs:   Basic Metabolic Panel: Recent Labs  Lab 12/06/22 2006 12/08/22 1103 12/09/22 0537  NA 137 136 137  K 3.9 3.9 3.7  CL 106 104 107  CO2 22 20* 22  GLUCOSE 104* 108* 115*  BUN 10 16 11   CREATININE 0.73 0.80 0.56  CALCIUM 8.9 9.1 8.8*   GFR Estimated Creatinine Clearance: 147.5 mL/min (by C-G formula based on SCr of 0.56 mg/dL). Liver Function Tests: Recent Labs  Lab 12/06/22 2006 12/08/22 1103  AST 16 24  ALT 18 24  ALKPHOS 73 76  BILITOT 0.5 0.6  PROT 7.9 8.4*  ALBUMIN 4.0 4.3   No results for input(s): "LIPASE", "AMYLASE" in the last 168 hours. No results for input(s): "AMMONIA"  in the last 168 hours. Coagulation profile No results for input(s): "INR", "PROTIME" in the last 168 hours.  CBC: Recent Labs  Lab 12/06/22 2006 12/08/22 1103 12/09/22 0537  WBC 14.8* 17.6* 12.7*  NEUTROABS 10.9* 14.7*  --   HGB 11.2* 11.9* 11.2*  HCT 34.2* 36.2 34.3*  MCV 85.7 86.0 86.2  PLT 388 443* 352   Cardiac Enzymes: No results for input(s): "CKTOTAL", "CKMB", "CKMBINDEX", "TROPONINI" in the last 168 hours. BNP: Invalid input(s): "POCBNP" CBG: No results for input(s): "GLUCAP" in the last 168  hours. D-Dimer No results for input(s): "DDIMER" in the last 72 hours. Hgb A1c No results for input(s): "HGBA1C" in the last 72 hours. Lipid Profile No results for input(s): "CHOL", "HDL", "LDLCALC", "TRIG", "CHOLHDL", "LDLDIRECT" in the last 72 hours. Thyroid function studies No results for input(s): "TSH", "T4TOTAL", "T3FREE", "THYROIDAB" in the last 72 hours.  Invalid input(s): "FREET3" Anemia work up No results for input(s): "VITAMINB12", "FOLATE", "FERRITIN", "TIBC", "IRON", "RETICCTPCT" in the last 72 hours. Microbiology Recent Results (from the past 240 hour(s))  SARS Coronavirus 2 by RT PCR (hospital order, performed in [facility] hospital lab) *cepheid single result test* Anterior Nasal Swab     Status: None   Collection Time: 12/08/22 10:53 AM   Specimen: Anterior Nasal Swab  Result Value Ref Range Status   SARS Coronavirus 2 by RT PCR NEGATIVE NEGATIVE Final    Comment: (NOTE) SARS-CoV-2 target nucleic acids are NOT DETECTED.  The SARS-CoV-2 RNA is generally detectable in upper and lower respiratory specimens during the acute phase of infection. The lowest concentration of SARS-CoV-2 viral copies this assay can detect is 250 copies / mL. A negative result does not preclude SARS-CoV-2 infection and should not be used as the sole basis for treatment or other patient management decisions.  A negative result may occur with improper specimen collection / handling, submission of specimen other than nasopharyngeal swab, presence of viral mutation(s) within the areas targeted by this assay, and inadequate number of viral copies (<250 copies / mL). A negative result must be combined with clinical observations, patient history, and epidemiological information.  Fact Sheet for Patients:   RoadLapTop.co.za  Fact Sheet for Healthcare Providers: http://kim-miller.com/  This test is not yet approved or  cleared by the [place] FDA  and has been authorized for detection and/or diagnosis of SARS-CoV-2 by FDA under an Emergency Use Authorization (EUA).  This EUA will remain in effect (meaning this test can be used) for the duration of the COVID-19 declaration under Section 564(b)(1) of the Act, 21 U.S.C. section 360bbb-3(b)(1), unless the authorization is terminated or revoked sooner.  Performed at [facility], [address] [place], [place] [address]   Blood culture (routine x 2)     Status: None (Preliminary result)   Collection Time: 12/08/22  2:45 PM   Specimen: BLOOD  Result Value Ref Range Status   Specimen Description   Final    BLOOD RIGHT ANTECUBITAL Performed at [facility], [address] [place], [place] [address]    Special Requests   Final    BOTTLES DRAWN AEROBIC AND ANAEROBIC Blood Culture adequate volume Performed at [facility], [address] [place], [place] [address]    Culture   Final    NO GROWTH < 24 HOURS Performed at [facility], [address] [place], [place] [address]    Report Status PENDING  Incomplete  Blood culture (routine x 2)  Status: None (Preliminary result)   Collection Time: 12/08/22  9:31 PM   Specimen: BLOOD  Result Value Ref Range Status   Specimen Description   Final    BLOOD BLOOD RIGHT HAND Performed at [facility], [address], [place], [place] [address]    Special Requests   Final    BOTTLES DRAWN AEROBIC ONLY Blood Culture adequate volume Performed at [facility], [address] [place], [place] [address]    Culture   Final    NO GROWTH < 12 HOURS Performed at [facility], [address], [place], [place] [address]    Report Status PENDING  Incomplete     Discharge Instructions:   Discharge Instructions     Diet general   Complete by: As directed       Allergies as of 12/09/2022       Reactions   Amoxicillin Hives,  Itching        Medication List     TAKE these medications    hydrOXYzine 25 MG tablet Commonly known as: ATARAX Take 1 tablet (25 mg total) by mouth 3 (three) times daily as needed for anxiety.   risperiDONE 0.5 MG tablet Commonly known as: RISPERDAL Take 3 tablets (1.5 mg total) by mouth at bedtime.   sulfamethoxazole-trimethoprim 800-160 MG tablet Commonly known as: BACTRIM DS Take 1 tablet by mouth every 12 (twelve) hours for 2 days.   traZODone 100 MG tablet Commonly known as: DESYREL Take 1 tablet (100 mg total) by mouth at bedtime as needed for sleep.          Time coordinating discharge: 45 min  Signed:  Joseph Art DO  [facility] 12/09/2022, 10:32 AM

## 2022-12-09 NOTE — TOC Transition Note (Signed)
Transition of Care [facility]) - CM/SW Discharge Note   Patient Details  Name: Kara Nguyen MRN: [id number] Date of Birth: [birth year]  Transition of Care [facility]) CM/SW Contact:  Otelia Santee, LCSW Phone Number: 12/09/2022, 11:08 AM   Clinical Narrative:    Pt accepted to [facility]. Pt will be going to room 501-1. RN to call report to [phone]. Pt is able to arrive following discharges from [facility]. IVC has been faxed to [phone]. Pt will be transported via [employer]. Transport can be arranged by calling [phone].     Final next level of care: Psychiatric Hospital Barriers to Discharge: Continued Medical Work up   Patient Goals and CMS Choice CMS Medicare.gov Compare Post Acute Care list provided to::  (NA) Choice offered to / list presented to :  (NA)  Discharge Placement                  Patient to be transferred to facility by: Law enforcement Name of family member notified: Father Patient and family notified of of transfer: 12/09/22  Discharge Plan and Services Additional resources added to the After Visit Summary for   In-house Referral: NA Discharge Planning Services: NA Post Acute Care Choice:  (NA)          DME Arranged: N/A DME Agency: NA                  Social Determinants of Health (SDOH) Interventions SDOH Screenings   Food Insecurity: Patient Declined (12/08/2022)  Housing: Low Risk  (12/08/2022)  Transportation Needs: No Transportation Needs (12/08/2022)  Utilities: Patient Declined (12/08/2022)  Tobacco Use: Low Risk  (12/07/2022)     Readmission Risk Interventions     No data to display

## 2022-12-09 NOTE — Progress Notes (Signed)
Patient ID: Kara Nguyen, female   DOB: 2001-03-10, 21 y.o.   MRN: [id number]   Pt presented with her father on admission. Pt's father provided RN with a list of medication pt is currently taking at home. Pt was cooperative and pressured in speech. Pt signed only some of the admission paperwork and became noticeably agitated and anxious and could not sign the remainder of the admission paperwork. Education, support, reassurance, and encouragement provided, q15 minute safety checks initiated. Pt's belongings in locker #41. Pt ambulating on the unit with no issues. Pt remains safe on and off the unit.

## 2022-12-09 NOTE — Progress Notes (Signed)
   12/09/22 2045  Psych Admission Type (Psych Patients Only)  Admission Status Involuntary  Psychosocial Assessment  Patient Complaints None  Eye Contact Avoids  Facial Expression Flat  Affect Preoccupied  Speech Soft  Interaction Guarded  Motor Activity Pacing;Slow (in hallway at times)  Appearance/Hygiene Layered clothes  Behavior Characteristics Guarded  Mood Preoccupied  Thought Process  Coherency Circumstantial  Content Preoccupation  Delusions None reported or observed  Perception WDL  Hallucination None reported or observed  Judgment Impaired  Confusion None  Danger to Self  Current suicidal ideation? Denies  Self-Injurious Behavior No self-injurious ideation or behavior indicators observed or expressed   Agreement Not to Harm Self Yes  Description of Agreement verbal  Danger to Others  Danger to Others None reported or observed   Progress note   D: Pt seen in dayroom after group. Used Solicitor [phone], but pt speaks [place] well. Pt denies SI, HI, AVH. Says she had self-harm thoughts earlier without a plan. Contracts for safety. Delayed responses. Distracted and preoccupied. Head covered with a blanket. Pt rates pain  0/10. Pt rates anxiety  0/10 and depression  0/10. Pt states she was compliant with her medications PTA. Says her sleep is okay. Avoids eye contact. Will walk the hall at times. No behavior issues noted. No other concerns noted at this time.  A: Pt provided support and encouragement. Pt given scheduled medication as prescribed. PRNs as appropriate. Q15 min checks for safety.   R: Pt safe on the unit. Will continue to monitor.

## 2022-12-09 NOTE — TOC Initial Note (Signed)
Transition of Care [facility]) - Initial/Assessment Note    Patient Details  Name: Kara Nguyen MRN: [id number] Date of Birth: Jan 28, 2001  Transition of Care [facility]) CM/SW Contact:    Otelia Santee, LCSW Phone Number: 12/09/2022, 9:58 AM  Clinical Narrative:                 Pt currently recommended for inpatient psychiatric hospitalization due to insomnia, AH, delusions, paranoia, and disorganized thoughts and behaviors. Pt has been accepted to [facility] however, pt is not currently medically ready. TOC will continue to follow for medical readiness to assist with discharge planning to psychiatric facility.   Expected Discharge Plan: Psychiatric Hospital Barriers to Discharge: Continued Medical Work up   Patient Goals and CMS Choice Patient states their goals for this hospitalization and ongoing recovery are:: For pt to go to inpatient psychiatric facility CMS Medicare.gov Compare Post Acute Care list provided to::  (NA) Choice offered to / list presented to :  (NA) [facility]ake ownership interest in [facility].provided to::  (NA)    Expected Discharge Plan and Services In-house Referral: NA Discharge Planning Services: NA Post Acute Care Choice:  (NA) Living arrangements for the past 2 months: Single Family Home                 DME Arranged: N/A DME Agency: NA                  Prior Living Arrangements/Services Living arrangements for the past 2 months: Single Family Home Lives with:: Parents, Siblings Patient language and need for interpreter reviewed:: Yes        Need for Family Participation in Patient Care: Yes (Comment) Care giver support system in place?: Yes (comment)   Criminal Activity/Legal Involvement Pertinent to Current Situation/Hospitalization: No - Comment as needed  Activities of Daily Living Home Assistive Devices/Equipment: Eyeglasses ADL Screening (condition at time of admission) Patient's cognitive ability adequate to safely  complete daily activities?: Yes Is the patient deaf or have difficulty hearing?: No Does the patient have difficulty seeing, even when wearing glasses/contacts?: No Does the patient have difficulty concentrating, remembering, or making decisions?: No Patient able to express need for assistance with ADLs?: Yes Does the patient have difficulty dressing or bathing?: No Independently performs ADLs?: Yes (appropriate for developmental age) Does the patient have difficulty walking or climbing stairs?: No Weakness of Legs: None Weakness of Arms/Hands: None  Permission Sought/Granted   Permission granted to share information with : No              Emotional Assessment   Attitude/Demeanor/Rapport: Unable to Assess Affect (typically observed): Unable to Assess Orientation: : Oriented to Self Alcohol / Substance Use: Not Applicable Psych Involvement: Yes (comment)  Admission diagnosis:  UTI (urinary tract infection) [N39.0] Insomnia due to other mental disorder [F51.05, F99] Patient Active Problem List   Diagnosis Date Noted   UTI (urinary tract infection) 12/08/2022   Schizophrenia spectrum disorder with psychotic disorder type not yet determined (HCC) 11/04/2022   Insomnia 11/04/2022   Anxiety state 11/04/2022   Bipolar disorder (HCC) 11/01/2022   Mania (HCC) 11/01/2022   Abnormal movements 11/06/2019   Panic disorder 11/06/2019   Transient motor tic 11/06/2019   Vaginal discharge 04/22/2017   Anorexia nervosa, binge-eating purging type 08/27/2015   Oligomenorrhea 08/26/2015   Vitamin D deficiency 08/26/2015   Iron deficiency anemia 08/19/2015   History of self-harm 08/19/2015   PCP:  Barbarann Ehlers Pharmacy:  [id number] [place], [place] - [address] [place] [address] Phone: [phone] Fax: [phone]     Social Determinants of Health (SDOH) Social History: SDOH Screenings   Food  Insecurity: Patient Declined (12/08/2022)  Housing: Low Risk  (12/08/2022)  Transportation Needs: No Transportation Needs (12/08/2022)  Utilities: Patient Declined (12/08/2022)  Tobacco Use: Low Risk  (12/07/2022)   SDOH Interventions:     Readmission Risk Interventions     No data to display

## 2022-12-09 NOTE — Progress Notes (Signed)
Patient ID: Kara Nguyen, female   DOB: [birth year], 21 y.o.   MRN: [id number]  Initial Treatment Plan 12/09/2022 6:15 PM Trena Blamer WUJ:[id number]    PATIENT STRESSORS: Loss of relationship   Medication change or noncompliance     PATIENT STRENGTHS: Religious Affiliation  Supportive family/friends    PATIENT IDENTIFIED PROBLEMS: Suicidal ideation  Psychosis  Depression  Anxiety               DISCHARGE CRITERIA:  Improved stabilization in mood, thinking, and/or behavior Reduction of life-threatening or endangering symptoms to within safe limits Verbal commitment to aftercare and medication compliance  PRELIMINARY DISCHARGE PLAN: Outpatient therapy Participate in family therapy Return to previous living arrangement Return to previous work or school arrangements  PATIENT/FAMILY INVOLVEMENT: This treatment plan has been presented to and reviewed with the patient, Kara Nguyen, and/or family member.  The patient and family have been given the opportunity to ask questions and make suggestions.  Tania Ade, RN 12/09/2022, 6:15 PM

## 2022-12-09 NOTE — Progress Notes (Signed)
   12/09/22 0534  Assess: MEWS Score  Temp 98.7 F (37.1 C)  BP (!) 156/86  MAP (mmHg) 106  Pulse Rate (!) 118  Resp 18  Level of Consciousness Alert  SpO2 100 %  O2 Device Nasal Cannula  O2 Flow Rate (L/min) 2 L/min  Assess: MEWS Score  MEWS Temp 0  MEWS Systolic 0  MEWS Pulse 2  MEWS RR 0  MEWS LOC 0  MEWS Score 2  MEWS Score Color Yellow  Assess: if the MEWS score is Yellow or Red  Were vital signs taken at a resting state? Yes  Focused Assessment No change from prior assessment  Does the patient meet 2 or more of the SIRS criteria? No  MEWS guidelines implemented  Yes, yellow  Treat  MEWS Interventions Considered administering scheduled or prn medications/treatments as ordered  Take Vital Signs  Increase Vital Sign Frequency  Yellow: Q2hr x1, continue Q4hrs until patient remains green for 12hrs  Escalate  MEWS: Escalate Yellow: Discuss with charge nurse and consider notifying provider and/or RRT  Provider Notification  Provider Name/Title Garner Nash NP  Date Provider Notified 12/09/22  Time Provider Notified [phone]  Method of Notification Page  Notification Reason Other (Comment) (Yellow MEWS)  Provider response No new orders  Assess: SIRS CRITERIA  SIRS Temperature  0  SIRS Pulse 1  SIRS Respirations  0  SIRS WBC 0  SIRS Score Sum  1

## 2022-12-09 NOTE — Progress Notes (Signed)
Adult Psychoeducational Group Note  Date:  12/09/2022 Time:  8:40 PM  Group Topic/Focus:  Wrap-Up Group:   The focus of this group is to help patients review their daily goal of treatment and discuss progress on daily workbooks.  Participation Level:  Active  Participation Quality:  Appropriate  Affect:  Appropriate  Cognitive:  Appropriate  Insight: Appropriate  Engagement in Group:  Engaged  Modes of Intervention:  Support  Additional Comments:  Pt attended group today. Pt stated that she had a good day.  Satira Anis 12/09/2022, 8:40 PM

## 2022-12-09 NOTE — Progress Notes (Signed)
Report called to [place] at [facility].

## 2022-12-09 NOTE — Progress Notes (Signed)
Pt was accepted to [facility] TODAY 12/09/2022, pending IVC paperwork faxed to [phone]. Bed assignment: 501-1  Pt meets inpatient criteria per Margaretha Seeds, MD  Attending Physician will be Phineas Inches, MD  Report can be called to: - Adult unit: [phone]  Pt can arrive after, pending discharges  Care Team Notified: [facility] Rona Ravens, RN, Marlin Canary, DO, Lorella Nimrod, RN, Dossie Arbour, RN, Caryn Bee, NP, and Margaretha Seeds, MD  Cathie Beams, LCSW  12/09/2022 10:23 AM

## 2022-12-10 DIAGNOSIS — F29 Unspecified psychosis not due to a substance or known physiological condition: Secondary | ICD-10-CM | POA: Diagnosis not present

## 2022-12-10 LAB — CULTURE, BLOOD (ROUTINE X 2): Culture: NO GROWTH

## 2022-12-10 MED ORDER — RISPERIDONE 2 MG PO TBDP
2.0000 mg | ORAL_TABLET | Freq: Every day | ORAL | Status: DC
  Administered 2022-12-10 – 2022-12-17 (×8): 2 mg via ORAL
  Filled 2022-12-10 (×9): qty 1

## 2022-12-10 NOTE — Progress Notes (Signed)
Pt talking to herself about how she has to go. Pt out of her room and resisting redirection. Pt going towards unit door even though she has been redirected away. Pt refusing to return to her room. Pt given agitation protocol per [facility] without incident. Pt walked back to her room. Pt inside her room at the door kneeling down and hands together like she is praying. Pt muttering to herself. Will continue to monitor.

## 2022-12-10 NOTE — Progress Notes (Signed)
   12/10/22 0558  15 Minute Checks  Location Bedroom  Visual Appearance Calm  Behavior Sleeping  Sleep (Behavioral Health Patients Only)  Calculate sleep? (Click Yes once per 24 hr at 0600 safety check) Yes  Documented sleep last 24 hours 6.25

## 2022-12-10 NOTE — BHH Group Notes (Signed)
Patient did not attend the Wrap-up group. 

## 2022-12-10 NOTE — Progress Notes (Signed)
Pt noted to be very disorganized, confused this evening and actively responding to internal stimuli. Pt observed kneeling on floor with hands held up as in a prayer position "help me, I'm very paranoid and scared of the way I want to destroy the world". Walking in hall with eye closed laughing inappropriately and tearful at times. Poured her juice in her dinner. Refused to get away from double doors. Verbal redirections ineffective at the time. Agitation protocol administered at 1722 with some relief when reassessed at 1820. Pt continues to need frequent redirections. Safety maintained at Q 15 minutes intervals. Pt tolerated fluids, meals and medications.

## 2022-12-10 NOTE — Progress Notes (Addendum)
   12/10/22 2040  Psych Admission Type (Psych Patients Only)  Admission Status Involuntary  Psychosocial Assessment  Patient Complaints None  Eye Contact Poor  Facial Expression Flat;Pensive  Affect Preoccupied  Speech Soft;Tangential  Interaction Intrusive  Motor Activity Pacing;Slow  Appearance/Hygiene Unremarkable  Behavior Characteristics Cooperative;Intrusive;Pacing  Mood Preoccupied  Thought Process  Coherency Blocking  Content Preoccupation  Delusions None reported or observed  Perception Hallucinations  Hallucination None reported or observed  Judgment Impaired  Confusion None  Danger to Self  Current suicidal ideation? Denies  Danger to Others  Danger to Others None reported or observed   Progress note   D: Pt seen pacing the halls talking to herself. Pt denies SI, HI, AVH. Pt rates pain  0/10. Pt rates anxiety  0/10 and depression  0/10. Delayed responses to questions. Pt able to be redirected. Eye contact poor. States her day has been good and best thing about her day is that staff is here to help her. No other concerns noted at this time.  A: Pt provided support and encouragement. Pt given scheduled medication as prescribed. PRNs as appropriate. Q15 min checks for safety.   R: Pt safe on the unit. Will continue to monitor.

## 2022-12-10 NOTE — BHH Counselor (Signed)
Adult Comprehensive Assessment  Patient ID: Kara Nguyen, female   DOB: [birth year], 21 y.o.   MRN: [id number]  Information Source: Information source: Patient  Current Stressors:  Patient states their primary concerns and needs for treatment are:: Pt states that she doesn't know why she is here,  She does not want to elaborate about needs Patient states their goals for this hospitilization and ongoing recovery are:: patient unable to identify goals Educational / Learning stressors: patient states that she used to be in school but that it became too stressful Employment / Job issues: patient states that she does not have a job Family Relationships: patient states she has good family support Surveyor, quantity / Lack of resources (include bankruptcy): patietn states that parents help support her Housing / Lack of housing: patient has been living with her family for the past 6 years Physical health (include injuries & life threatening diseases): no stressors Social relationships: limited social support Substance abuse: denies substance use Bereavement / Loss: no stressors  Living/Environment/Situation:  Living Arrangements: Parent, Other relatives Living conditions (as described by patient or guardian): good environment Who else lives in the home?: mother, father, brother How long has patient lived in current situation?: 6 years What is atmosphere in current home: Supportive, Comfortable  Family History:  Marital status: Single Are you sexually active?: No Has your sexual activity been affected by drugs, alcohol, medication, or emotional stress?: n/a Does patient have children?: No  Childhood History:  By whom was/is the patient raised?: Both parents Additional childhood history information: patient states that her childhood was hard but she recieved the necessities as a child Description of patient's relationship with caregiver when they were a child: Good Patient's description of  current relationship with people who raised him/her: good How were you disciplined when you got in trouble as a child/adolescent?: normally Does patient have siblings?: Yes Number of Siblings: 1 Description of patient's current relationship with siblings: brother, good relationship Did patient suffer any verbal/emotional/physical/sexual abuse as a child?: No Did patient suffer from severe childhood neglect?: No Has patient ever been sexually abused/assaulted/raped as an adolescent or adult?: No Was the patient ever a victim of a crime or a disaster?: No Witnessed domestic violence?: No Has patient been affected by domestic violence as an adult?: No  Education:  Highest grade of school patient has completed: 12th- patient went to some college Currently a student?: No Learning disability?: No  Employment/Work Situation:   Employment Situation: Unemployed Patient's Job has Been Impacted by Current Illness: Yes Describe how Patient's Job has Been Impacted: Mother reports patient doesn't want to go to school because she says people talk about her, and patient is also afraid of being around other people.  What is the Longest Time Patient has Held a Job?: patient has not had a job Where was the Patient Employed at that Time?: N/A Has Patient ever Been in the [employer]?: No  Financial Resources:   Surveyor, quantity resources: Support from parents / caregiver Does patient have a Lawyer or guardian?: No  Alcohol/Substance Abuse:   What has been your use of drugs/alcohol within the last 12 months?: none If attempted suicide, did drugs/alcohol play a role in this?: No Alcohol/Substance Abuse Treatment Hx: Denies past history Has alcohol/substance abuse ever caused legal problems?: No  Social Support System:   Conservation officer, nature Support System: Fair Development worker, community Support System: family Type of faith/religion: Catholic How does patient's faith help to cope with current illness?:  patient states she prays and  reads the bible  Leisure/Recreation:   Do You Have Hobbies?: Yes Leisure and Hobbies: Drawing  Strengths/Needs:   What is the patient's perception of their strengths?: patient states that she is good at drawing Patient states they can use these personal strengths during their treatment to contribute to their recovery: yes Patient states these barriers may affect/interfere with their treatment: unable to identify Patient states these barriers may affect their return to the community: unable to identify Other important information patient would like considered in planning for their treatment: n/a  Discharge Plan:   Currently receiving community mental health services: Yes (From Whom) ([facility]) Patient states concerns and preferences for aftercare planning are: none Patient states they will know when they are safe and ready for discharge when: patient states that her stress level will get better Does patient have access to transportation?: Yes Does patient have financial barriers related to discharge medications?: No Patient description of barriers related to discharge medications: none Will patient be returning to same living situation after discharge?: Yes  Summary/Recommendations:   Summary and Recommendations (to be completed by the evaluator): Kara Nguyen is a 22 year old female who was admitted to [facility] for disorientation and disorganization.  She was brought to the hospital by her father.  Patient states that she has a supportive family and currently lives with them.  Upon assessment, patient was very minimal with answers.  She endorses no substance use or ongoing stressors.  Patient was in the hospital at the beginning of this month and had follow up at [facility].  Patient states that she followed up with [facility] after last admission and would like to go back there. While here, [name] can benefit from crisis stabilization, medication management, therapeutic milieu, and referrals  for services.   [name]. 12/10/2022

## 2022-12-10 NOTE — Progress Notes (Signed)
Pt continues to pace in hallway and kneel down at door to unit. Pt redirected to move away from the door. Pt muttering to herself and goes in dayroom. A few minutes later, patient stands up and walks to the door of unit and the ritual starts again. Pt will be given PRNs as appropriate.

## 2022-12-10 NOTE — Group Note (Signed)
Occupational Therapy Group Note  Group Topic: Sleep Hygiene  Group Date: 12/10/2022 Start Time: 1400 End Time: 1438 Facilitators: Ted Mcalpine, OT   Group Description: Group encouraged increased participation and engagement through topic focused on sleep hygiene. Patients reflected on the quality of sleep they typically receive and identified areas that need improvement. Group was given background information on sleep and sleep hygiene, including common sleep disorders. Group members also received information on how to improve one's sleep and introduced a sleep diary as a tool that can be utilized to track sleep quality over a length of time. Group session ended with patients identifying one or more strategies they could utilize or implement into their sleep routine in order to improve overall sleep quality.        Therapeutic Goal(s):  Identify one or more strategies to improve overall sleep hygiene  Identify one or more areas of sleep that are negatively impacted (sleep too much, too little, etc)     Participation Level: Minimal   Participation Quality: Minimal Cues   Behavior: Bizarre and Disinterested   Speech/Thought Process: Barely audible and Unfocused   Affect/Mood: Flat   Insight: Limited   Judgement: Limited      Modes of Intervention: Education  Patient Response to Interventions:  Disengaged   Plan: Continue to engage patient in OT groups 2 - 3x/week.  12/10/2022  Ted Mcalpine, OT  Kerrin Champagne, OT

## 2022-12-10 NOTE — [facility] Suicide Risk Assessment (Signed)
Suicide Risk Assessment  Admission Assessment    [facility] Admission Suicide Risk Assessment   Nursing information obtained from:  Patient, Family (father)  Demographic factors:  Adolescent or young adult  Current Mental Status:  Suicidal ideation indicated by patient, Self-harm thoughts  Loss Factors:  Loss of significant relationship  Historical Factors:  Prior suicide attempts  Risk Reduction Factors:  Living with another person, especially a relative, Positive social support  Total Time spent with patient: 1 hour  Principal Problem: Psychosis (HCC)  Diagnosis:  Principal Problem:   Psychosis (HCC)  Subjective Data: Patient currently presents as a poor historian. See H&P.  Continued Clinical Symptoms:    The "Alcohol Use Disorders Identification Test", Guidelines for Use in Primary Care, Second Edition.  World Science writer [facility]). Score between 0-7:  no or low risk or alcohol related problems. Score between 8-15:  moderate risk of alcohol related problems. Score between 16-19:  high risk of alcohol related problems. Score 20 or above:  warrants further diagnostic evaluation for alcohol dependence and treatment.  CLINICAL FACTORS:   Currently Psychotic Unstable or Poor Therapeutic Relationship Previous Psychiatric Diagnoses and Treatments   Musculoskeletal: Strength & Muscle Tone: within normal limits Gait & Station: normal Patient leans: N/A  Psychiatric Specialty Exam:  Presentation  General Appearance:  Disheveled (malodorous.)  Eye Contact: Other (comment) (None)  Speech: Slow; Other (comment) (non-spontaneous.)  Speech Volume: Decreased  Handedness: Right   Mood and Affect  Mood: -- (Psychotic.)  Affect: Blunt; Congruent   Thought Process  Thought Processes: Disorganized  Descriptions of Associations:Tangential  Orientation:Partial  Thought Content:Illogical; Tangential  History of Schizophrenia/Schizoaffective  disorder:Yes  Duration of Psychotic Symptoms:Greater than six months  Hallucinations:Hallucinations: -- (Patient appears to be internally pre-occupied.) Description of Auditory Hallucinations: Patient appears to be internally pre-occupied.  Ideas of Reference:Delusions  Suicidal Thoughts:Suicidal Thoughts: -- (Unable to assess at this time.)  Homicidal Thoughts:Homicidal Thoughts: -- (Unable to assess at this time.)   Sensorium  Memory: Immediate Poor; Recent Poor; Remote Poor  Judgment: Impaired  Insight: Lacking   Executive Functions  Concentration: Poor  Attention Span: Poor  Recall: Poor  Fund of Knowledge: Poor  Language: Fair  Psychomotor Activity  Psychomotor Activity: Psychomotor Activity: Decreased  Assets  Assets: Health and safety inspector; Housing; Social Support; Resilience  Sleep  Sleep: Sleep: Fair Number of Hours of Sleep: 5.5  Physical Exam: See H&P.  Blood pressure 112/70, pulse (!) 116, temperature 97.9 F (36.6 C), temperature source Oral, resp. rate 20, height 5\' 9"  (1.753 m), weight 110.5 kg, SpO2 99 %. Body mass index is 35.97 kg/m.  COGNITIVE FEATURES THAT CONTRIBUTE TO RISK:  Loss of executive function    SUICIDE RISK:   Moderate:  Frequent suicidal ideation with limited intensity, and duration, some specificity in terms of plans, no associated intent, good self-control, limited dysphoria/symptomatology, some risk factors present, and identifiable protective factors, including available and accessible social support.  PLAN OF CARE: See H&P.  I certify that inpatient services furnished can reasonably be expected to improve the patient's condition.   Armandina Stammer, NP, pmhnp, fnp-bc. 12/10/2022, 2:14 PM

## 2022-12-10 NOTE — Group Note (Signed)
Recreation Therapy Group Note   Group Topic:Goal Setting  Group Date: 12/10/2022 Start Time: 1035 End Time: 1100 Facilitators: Pookela Sellin-McCall, LRT,CTRS Location: 500 Hall Dayroom   Goal Area(s) Addresses:  Patient will identify at least 3 long-term goal for their life.  Patient will reflect on short-term steps and support they will need to reach their goal(s).  Group Description:  Goal Setting. Patients were given a worksheet in which they were to identify their main priorities.  Patients were to then set goals to reach each priority they identified.  In addition, patients had to identified the action steps to reaching these goals.  Patients then shared 3 of their goals with the group.   Affect/Mood: N/A   Participation Level: Did not attend    Clinical Observations/Individualized Feedback:     Plan: Continue to engage patient in RT group sessions 2-3x/week.   Keshonda Monsour-McCall, LRT,CTRS 12/10/2022 12:52 PM

## 2022-12-10 NOTE — Progress Notes (Signed)
Patient observed to be calm and cooperative this morning. Patient denied SI, HI and endorsed AH which was, at that moment, more "background" about "religious things." Patient rested in room most of the morning. Patient attended group after lunch and was observed to be responding to internal stimuli, pacing, responding to unseen conversation and holding arms up and as though in prayer. Patient was crying in the day room this afternoon and stated "I'm losing it." "I feel like my brain is not mine." Patient is disorganized and does not respond directly to the question asked. Patient received PRN hydroxyzine and sat calmly in the day room.   12/10/22 1000  Psych Admission Type (Psych Patients Only)  Admission Status Involuntary  Psychosocial Assessment  Patient Complaints  (auditory hallucination "religious thing")  Eye Contact Brief  Facial Expression Flat;Pensive  Affect Preoccupied;Apprehensive  Speech Soft  Interaction Guarded  Motor Activity Slow  Appearance/Hygiene Unremarkable  Behavior Characteristics Guarded  Mood Preoccupied  Thought Process  Coherency Blocking  Content Preoccupation  Delusions None reported or observed  Perception Hallucinations  Hallucination Auditory  Judgment Impaired  Confusion None  Danger to Self  Current suicidal ideation? Denies  Self-Injurious Behavior No self-injurious ideation or behavior indicators observed or expressed   Agreement Not to Harm Self Yes  Description of Agreement verbal  Danger to Others  Danger to Others None reported or observed

## 2022-12-10 NOTE — Progress Notes (Signed)
Pt given PRN IM agitation protocol per [facility], pt accepted without incident

## 2022-12-10 NOTE — Progress Notes (Signed)
Recreation Therapy Notes  Patient admitted to unit 5.29.24. Due to admission within last year, no new recreation therapy assessment conducted at this time. Last assessment conducted on 4.25.24. Pt had her eyes closed and had just woken up.    Reason for current admission per patient, "Kara lot of problems".  Patient reports no changes in stressors from previous admission.  Patient reports goal of "to get better and never come back".  Patient reports coping skills, leisure interests, strengths and areas of improvement are the same as previous admission.  Patient denies SI, HI, AVH at this time.    Information found below from assessment conducted 4.25.24.  Stessors: "Audiological scientist: Isolation, Journal, Music, Deep Breathing, Meditate, Talk, Art, Avoidance  Leisure Interests: Draw, Writing, Human resources officer, Create Stories  Patient Strengths: Creativity  Areas of Improvement: Motivation    Kara Nguyen, LRT,CTRS Kara Nguyen Kara Nguyen 12/10/2022 1:17 PM

## 2022-12-10 NOTE — H&P (Signed)
Psychiatric Admission Assessment Adult  Patient Identification: Kara Nguyen  MRN:  [id number]  Date of Evaluation:  12/10/2022  Chief Complaint:  Psychosis [facility]) [F29]  Principal Diagnosis: Schizophrenia spectrum disorder with psychotic disorder type not yet determined (HCC)  Diagnosis:  Principal Problem:   Schizophrenia spectrum disorder with psychotic disorder type not yet determined [facility]) Active Problems:   Psychosis (HCC)  History of Present Illness: This the second psychiatric admission/treatments in this [facility] for this 22 year old AA female with hx of mental illness. [name] was a patient in this [facility] last month (April, 2024) from 11-03-22 thru 11-09-22. She was stabilized & discharged on medications with an outpatient psychiatric appointments scheduled at the [facility] in [place], [place]. It is however, unsure at this time if patient was compliant with her outpatient psychiatric recommendations as she currently presents as a poor historian. She is being re-admitted to the [facility] this time around with complain of worsening psychosis & insomnia. And while at the [facility] ED, she was noted to have urinary tract infections after she became feverish. She received some IV fluids/antibiotic treatment & later discharged on oral antibiotics. After medical evaluation/stabilization/clearance, [name] was transferred to the [facility] for further psychiatric evaluation & treatments. During this evaluation, [name] reports, "I'm catholic, dealing with a lot of emotions (anxiety, depression & insomnia). All of these started a month ago. They were triggered by my dad or something. We had a fight, but it is okay. My dad took me to the [facility] health. I have not been taking my medicines in 6 years. I did not have time to love. I'm feeling free now. At the moment, I'm not hearing voices or seeing things. I was sleeping well, but I did not sleep last night".  Objective: [name] presents obese, disheveled &  malodorous. She appears older than stated age. She presents alert, disoriented to time & situation. She presents disorganized, tangential & irrational. She presents as a poor historian & illogical. There is nothing that came out of her mouth that made any sense. And through out this assessment, patient was sitting up on the side of her bed with her feet resting on a towel on the floor. Both eyes were closed & affect restricted. The only thing that was moving is her mouth & yet her speech is non-spontaneous. There is a latency of speech as well. Patient appears internally pre-occupied & there is no physical movements that indicated that she is responding to any internal stimuli. Discussed this case with the attending psychiatrist. See the treatment plan below. Patient at this time does not appear to be in any distress. Her pulse rate is elevated. Staff will recheck.  Associated Signs/Symptoms:  Depression Symptoms:  insomnia, psychomotor agitation, anxiety, Irritability.  (Hypo) Manic Symptoms:  Irritable Mood, Labiality of Mood,  Anxiety Symptoms:  Excessive Worry,  Psychotic Symptoms:   Patient currently appears internally pre-occupied.  PTSD Symptoms: Unable to assess. Patient presents as a poor historian & internally pre-occupied. NA  Total Time spent with patient: 1 hour  Past Psychiatric History: Per chart review: Previous Psych Diagnoses include: Bipolar disorder. Prior inpatient treatment: 01/09/2018 at [facility], April, 2024. Current/prior outpatient treatment: None  Prior rehabilitation WU:JWJX  Psychotherapy BJ:YNWG  History of suicide attempt: unable to obtain this information.  History of homicide or aggression: Unable to obtain this information at this time.  Psychiatric medication history: AS per chart review, Lamictal, Zyprexa, Hydroxyzine.  Psychiatric medication compliance history: Non-compliance  Neuromodulation history: none  Current Psychiatrist:  [facility] in [place],  [place]. Current therapist: None   Is the patient at risk to self? No.  Has the patient been a risk to self in the past 6 months? Yes.    Has the patient been a risk to self within the distant past? Yes.    Is the patient a risk to others? No.  Has the patient been a risk to others in the past 6 months? No.  Has the patient been a risk to others within the distant past? No.   [place] Scale:  Flowsheet Row [facility] ([facility] Most recent reading at 12/09/2022  4:15 PM ED to Hosp-Admission (Discharged) from 12/06/2022 in [facility] Most recent reading at 12/08/2022  6:37 PM ED from 12/06/2022 in [facility] Most recent reading at 12/06/2022  7:25 AM  C-SSRS RISK CATEGORY High Risk No Risk No Risk      Prior Inpatient Therapy: Yes.   If yes, describe: [facility] x 2   Prior Outpatient Therapy: Yes.   If yes, describe: [facility]   Alcohol Screening: Patient refused Alcohol Screening Tool:  (No) 1. How often do you have a drink containing alcohol?: Never 2. How many drinks containing alcohol do you have on a typical day when you are drinking?: 1 or 2 3. How often do you have six or more drinks on one occasion?: Never AUDIT-C Score: 0 Alcohol Brief Interventions/Follow-up: Alcohol education/Brief advice  Substance Abuse History in the last 12 months:  Yes.    Consequences of Substance Abuse: NA  Previous Psychotropic Medications: Yes   Psychological Evaluations: No   Past Medical History:  Past Medical History:  Diagnosis Date   Anxiety    Bipolar disorder (HCC)    Deliberate self-cutting    Depression    History reviewed. No pertinent surgical history. Family History:  Family History  Problem Relation Age of Onset   Cancer Other    Diabetes Paternal Grandmother    Family Psychiatric  History: Unable to this information at this time. Patient presents as a poor  historian. She is internally pre-occupied.  Tobacco Screening:  Social History   Tobacco Use  Smoking Status Never  Smokeless Tobacco Never    BH Tobacco Counseling     Are you interested in Tobacco Cessation Medications?  N/A, patient does not use tobacco products Counseled patient on smoking cessation:  N/A, patient does not use tobacco products Reason Tobacco Screening Not Completed: No value filed.       Social History: Per chart review/previous notes: Pt reports highest level of education as being high school, states that she resides with her parents and brother here in [place], loves to draw and listen to music, is Catholic by religion, is single and with no children and is heterosexual. She states that she was born and raised in [place]. She reports that she does not work, does not have a peer support, but parents and brother are supportive.  Social History   Substance and Sexual Activity  Alcohol Use Never   Alcohol/week: 0.0 standard drinks of alcohol     Social History   Substance and Sexual Activity  Drug Use Not Currently   Types: Solvent inhalants    Additional Social History: Marital status: Single Are you sexually active?: No Has your sexual activity been affected by drugs, alcohol, medication, or emotional stress?: n/a Does patient have children?: No   Allergies:  Allergies  Allergen Reactions   Amoxicillin Hives and Itching   Lab Results:  Results for orders placed or performed during the hospital encounter of 12/06/22 (from the past 48 hour(s))  Blood culture (routine x 2)     Status: None (Preliminary result)   Collection Time: 12/08/22  2:45 PM   Specimen: BLOOD  Result Value Ref Range   Specimen Description      BLOOD RIGHT ANTECUBITAL Performed at [facility], [address], [place], [place] [address]    Special Requests      BOTTLES DRAWN AEROBIC AND ANAEROBIC Blood Culture adequate volume Performed at [facility], [address] [place], [place] [address]    Culture      NO GROWTH 2 DAYS Performed at [facility], [address], [place], [place] [address]    Report Status PENDING   Lactic acid, plasma     Status: None   Collection Time: 12/08/22  2:45 PM  Result Value Ref Range   Lactic Acid, Venous 0.7 0.5 - 1.9 mmol/L    Comment: Performed at [facility], [address] [place], [place] [address]  Blood culture (routine x 2)     Status: None (Preliminary result)   Collection Time: 12/08/22  9:31 PM   Specimen: BLOOD  Result Value Ref Range   Specimen Description      BLOOD BLOOD RIGHT HAND Performed at [facility], [address], [place], [place] [address]    Special Requests      BOTTLES DRAWN AEROBIC ONLY Blood Culture adequate volume Performed at [facility], [address] [place], [place] [address]    Culture      NO GROWTH 2 DAYS Performed at [facility], [address] [place], [place] [address]    Report Status PENDING   Basic metabolic panel     Status: Abnormal   Collection Time: 12/09/22  5:37 AM  Result Value Ref Range   Sodium 137 135 - 145 mmol/L   Potassium 3.7 3.5 - 5.1 mmol/L   Chloride 107 98 - 111 mmol/L   CO2 22 22 - 32 mmol/L   Glucose, Bld 115 (H) 70 - 99 mg/dL    Comment: Glucose reference range applies only to samples taken after fasting for at least 8 hours.   BUN 11 6 - 20 mg/dL   Creatinine, Ser 9.62 0.44 - 1.00 mg/dL   Calcium 8.8 (L) 8.9 - 10.3 mg/dL   GFR, Estimated >95 >28 mL/min    Comment: (NOTE) Calculated using the CKD-EPI Creatinine Equation (2021)    Anion gap 8 5 - 15    Comment: Performed at [facility], [address], [place], [place] [address]  CBC     Status: Abnormal   Collection Time: 12/09/22  5:37 AM  Result Value Ref Range   WBC 12.7 (H) 4.0 - 10.5 K/uL   RBC 3.98 3.87 - 5.11 MIL/uL   Hemoglobin 11.2  (L) 12.0 - 15.0 g/dL   HCT 40.1 (L) 02.7 - 25.3 %   MCV 86.2 80.0 - 100.0 fL   MCH 28.1 26.0 - 34.0 pg   MCHC 32.7 30.0 - 36.0 g/dL   RDW 66.4 40.3 - 47.4 %   Platelets 352 150 - 400 K/uL   nRBC 0.0 0.0 - 0.2 %    Comment: Performed at [facility], [address] [place],  [place] [address]   Blood Alcohol level:  Lab Results  Component Value Date   ETH <10 12/06/2022   ETH <10 11/01/2022   Metabolic Disorder Labs:  Lab Results  Component Value Date   HGBA1C 4.8 11/06/2022   MPG 91.06 11/06/2022   MPG 93.93 01/16/2018   Lab Results  Component Value Date   PROLACTIN 29.5 (H) 01/10/2018   Lab Results  Component Value Date   CHOL 118 11/05/2022   TRIG 59 11/05/2022   HDL 49 11/05/2022   CHOLHDL 2.4 11/05/2022   VLDL 12 11/05/2022   LDLCALC 57 11/05/2022   LDLCALC 55 01/16/2018   Current Medications: Current Facility-Administered Medications  Medication Dose Route Frequency Provider Last Rate Last Admin   acetaminophen (TYLENOL) tablet 650 mg  650 mg Oral Q6H PRN Starkes-Perry, Juel Burrow, FNP       alum & mag hydroxide-simeth (MAALOX/MYLANTA) 200-200-20 MG/5ML suspension 30 mL  30 mL Oral Q4H PRN Starkes-Perry, Juel Burrow, FNP       diphenhydrAMINE (BENADRYL) capsule 50 mg  50 mg Oral TID PRN Maryagnes Amos, FNP       Or   diphenhydrAMINE (BENADRYL) injection 50 mg  50 mg Intramuscular TID PRN Maryagnes Amos, FNP   50 mg at 12/10/22 0201   haloperidol (HALDOL) tablet 5 mg  5 mg Oral TID PRN Maryagnes Amos, FNP       Or   haloperidol lactate (HALDOL) injection 5 mg  5 mg Intramuscular TID PRN Maryagnes Amos, FNP   5 mg at 12/10/22 0202   hydrOXYzine (ATARAX) tablet 25 mg  25 mg Oral TID PRN Maryagnes Amos, FNP       LORazepam (ATIVAN) tablet 2 mg  2 mg Oral TID PRN Maryagnes Amos, FNP       Or   LORazepam (ATIVAN) injection 2 mg  2 mg Intramuscular TID PRN Maryagnes Amos, FNP   2 mg at 12/10/22 0202    magnesium hydroxide (MILK OF MAGNESIA) suspension 30 mL  30 mL Oral Daily PRN Starkes-Perry, Juel Burrow, FNP       risperiDONE (RISPERDAL M-TABS) disintegrating tablet 2 mg  2 mg Oral QHS Massengill, Nathan, MD       sulfamethoxazole-trimethoprim (BACTRIM DS) 800-160 MG per tablet 1 tablet  1 tablet Oral Q12H Maryagnes Amos, FNP   1 tablet at 12/10/22 1914   traZODone (DESYREL) tablet 100 mg  100 mg Oral QHS,MR X 1 Maryagnes Amos, FNP   100 mg at 12/10/22 0126   PTA Medications: Medications Prior to Admission  Medication Sig Dispense Refill Last Dose   hydrOXYzine (ATARAX) 25 MG tablet Take 1 tablet (25 mg total) by mouth 3 (three) times daily as needed for anxiety. 30 tablet 0    risperiDONE (RISPERDAL) 0.5 MG tablet Take 3 tablets (1.5 mg total) by mouth at bedtime. 90 tablet 0    sulfamethoxazole-trimethoprim (BACTRIM DS) 800-160 MG tablet Take 1 tablet by mouth every 12 (twelve) hours for 2 days. 4 tablet 0    traZODone (DESYREL) 100 MG tablet Take 1 tablet (100 mg total) by mouth at bedtime as needed for sleep. 30 tablet 0    Musculoskeletal: Strength & Muscle Tone: within normal limits Gait & Station: normal Patient leans: N/A  Psychiatric Specialty Exam:  Presentation  General Appearance:  Disheveled (malodorous.)  Eye Contact: Other (comment) (None)  Speech: Slow; Other (comment) (non-spontaneous.)  Speech Volume: Decreased  Handedness: Right   Mood and  Affect  Mood: -- (Psychotic.)  Affect: Blunt; Congruent   Thought Process  Thought Processes: Disorganized  Duration of Psychotic Symptoms: Greater than two weeks.  Past Diagnosis of Schizophrenia or Psychoactive disorder: Yes  Descriptions of Associations:Tangential  Orientation:Partial  Thought Content:Illogical; Tangential  Hallucinations:Hallucinations: -- (Patient appears to be internally pre-occupied.) Description of Auditory Hallucinations: Patient appears to be internally  pre-occupied.  Ideas of Reference:Delusions  Suicidal Thoughts:Suicidal Thoughts: -- (Unable to assess at this time.)  Homicidal Thoughts:Homicidal Thoughts: -- (Unable to assess at this time.)  Sensorium  Memory: Immediate Poor; Recent Poor; Remote Poor  Judgment: Impaired  Insight: Lacking  Executive Functions  Concentration: Poor  Attention Span: Poor  Recall: Poor  Fund of Knowledge: Poor  Language: Fair  Psychomotor Activity  Psychomotor Activity: Psychomotor Activity: Decreased  Assets  Assets: Health and safety inspector; Housing; Social Support; Resilience  Sleep  Sleep: Sleep: Fair Number of Hours of Sleep: 5.5  Physical Exam: Physical Exam Vitals and nursing note reviewed.  HENT:     Nose: Nose normal.     Mouth/Throat:     Pharynx: Oropharynx is clear.  Cardiovascular:     Rate and Rhythm: Normal rate.     Comments: Elevated pulse rate: 116 Pulmonary:     Effort: Pulmonary effort is normal.  Genitourinary:    Comments: Deferred Musculoskeletal:        General: Normal range of motion.     Cervical back: Normal range of motion.  Skin:    General: Skin is warm and dry.  Neurological:     General: No focal deficit present.     Mental Status: She is alert and oriented to person, place, and time.    Review of Systems  Constitutional:  Negative for chills and fever.  HENT:  Negative for congestion and sore throat.   Respiratory:  Negative for cough, shortness of breath and wheezing.   Cardiovascular:  Negative for chest pain and palpitations.  Gastrointestinal:  Negative for abdominal pain, constipation, diarrhea, heartburn, nausea and vomiting.  Musculoskeletal:  Negative for joint pain and myalgias.  Neurological:  Negative for dizziness, tingling, tremors, sensory change, speech change, focal weakness, seizures, loss of consciousness, weakness and headaches.  Endo/Heme/Allergies:        Allergies: Amoxicillin   Psychiatric/Behavioral:  Positive for hallucinations (Patient appears internally pre-occupied.). Negative for memory loss, substance abuse and suicidal ideas. The patient has insomnia. The patient is not nervous/anxious.    Blood pressure 112/70, pulse (!) 116, temperature 97.9 F (36.6 C), temperature source Oral, resp. rate 20, height 5\' 9"  (1.753 m), weight 110.5 kg, SpO2 99 %. Body mass index is 35.97 kg/m.  Treatment Plan Summary: Daily contact with patient to assess and evaluate symptoms and progress in treatment and Medication management.   Principal/active diagnoses.  Schizophrenia spectrum disorder with psychotic disorder type not yet determined.   Associated symptoms.  Psychosis.  Insomnia. Agitation.  Plan:  -Initiated Risperdal M-tabs 2 mg po bid for mood control.  -Continue Trazodone 100 mg po Q hs prn, may repeat x 1 for insomnia.  -Continue Hydroxyzine 25 mg po tid prn for anxiety.   Agitation protocols: Cont as recommended;  -Benadryl 50 mg po or IM tid prn. -Haldol 5 mg po or IM tid prn.  -Lorazepam 2 mg po or IM tid prn.  Other medical issues.  -Continue Bactrim DS 800-160 po bid for uti x 6 doses.  Other PRNS -Continue Tylenol 650 mg every 6 hours PRN for mild pain -Continue  Maalox 30 ml Q 4 hrs PRN for indigestion -Continue MOM 30 ml po Q 6 hrs for constipation  Safety and Monitoring: Voluntary admission to inpatient psychiatric unit for safety, stabilization and treatment Daily contact with patient to assess and evaluate symptoms and progress in treatment Patient's case to be discussed in multi-disciplinary team meeting Observation Level : q15 minute checks Vital signs: q12 hours Precautions: Safety  Discharge Planning: Social work and case management to assist with discharge planning and identification of hospital follow-up needs prior to discharge Estimated LOS: 5-7 days Discharge Concerns: Need to establish a safety plan; Medication compliance  and effectiveness Discharge Goals: Return home with outpatient referrals for mental health follow-up including medication management/psychotherapy  Observation Level/Precautions:  15 minute checks  Laboratory:   Per ED, current lab results reviewed.  Psychotherapy: Enrolled in the group sessions.  Medications: See MAR.    Consultations: As needed.  Discharge Concerns: Safety, mood stability.  Estimated LOS: 3-5 days.  Other: nA   Physician Treatment Plan for Primary Diagnosis: Schizophrenia spectrum disorder with psychotic disorder type not yet determined (HCC)  Long Term Goal(s): Improvement in symptoms so as ready for discharge  Short Term Goals: Ability to identify changes in lifestyle to reduce recurrence of condition will improve, Ability to verbalize feelings will improve, Ability to disclose and discuss suicidal ideas, and Ability to demonstrate self-control will improve  Physician Treatment Plan for Secondary Diagnosis: Principal Problem:   Schizophrenia spectrum disorder with psychotic disorder type not yet determined (HCC) Active Problems:   Psychosis (HCC)  Long Term Goal(s): Improvement in symptoms so as ready for discharge  Short Term Goals: Ability to identify and develop effective coping behaviors will improve, Ability to maintain clinical measurements within normal limits will improve, and Compliance with prescribed medications will improve  I certify that inpatient services furnished can reasonably be expected to improve the patient's condition.    Armandina Stammer, NP, pmhnp, fnp-bc. 5/30/20242:36 PM

## 2022-12-10 NOTE — Progress Notes (Signed)
Pt coming out her room needing re-direction, when telling pt to go back to her room, pt pretends she does not understand and then starts speaking spanish. Pt informed she does this every time she comes to the hospital

## 2022-12-11 ENCOUNTER — Encounter (HOSPITAL_COMMUNITY): Payer: Self-pay

## 2022-12-11 DIAGNOSIS — F29 Unspecified psychosis not due to a substance or known physiological condition: Secondary | ICD-10-CM | POA: Diagnosis not present

## 2022-12-11 LAB — CULTURE, BLOOD (ROUTINE X 2)

## 2022-12-11 MED ORDER — RISPERIDONE 1 MG PO TBDP
1.0000 mg | ORAL_TABLET | Freq: Every day | ORAL | Status: DC
  Administered 2022-12-11 – 2022-12-14 (×4): 1 mg via ORAL
  Filled 2022-12-11 (×8): qty 1

## 2022-12-11 NOTE — Group Note (Signed)
Recreation Therapy Group Note   Group Topic:Coping Skills  Group Date: 12/11/2022 Start Time: 1000 End Time: 1031 Facilitators: Teren Franckowiak-McCall, LRT,CTRS Location: 500 Hall Dayroom   Goal Area(s) Addresses: Patient will define what a coping skill is. Patient will successfully identify positive coping skills they can use post d/c.  Patient will acknowledge benefit(s) of using learned coping skills post d/c.   Group Description: Coping A to Z. Patient asked to identify what a coping skill is and when they use them. Patients with Clinical research associate discussed healthy versus unhealthy coping skills. Next patients were given a blank worksheet titled "Coping Skills A-Z".  Patients were instructed to come up with at least one positive coping skill per letter of the alphabet. Patients were given 15 minutes to brainstorm before ideas were presented to the large group. Patients and LRT debriefed on the importance of coping skill selection based on situation and back-up plans when a skill tried is not effective. At the end of group, patients were given an handout of alphabetized strategies to keep for future reference.   Affect/Mood: Sleepy   Participation Level: Moderate   Participation Quality: Independent   Behavior: Drowsy   Speech/Thought Process: Coherent   Insight: Limited   Judgement: Limited   Modes of Intervention: Worksheet   Patient Response to Interventions:  Receptive   Education Outcome:  Acknowledges education   Clinical Observations/Individualized Feedback: Pt appeared very tired and sleepy during group.  Pt expressed not getting enough sleep last night.  Pt was able to make it through the group session.  Pt defined coping skills as something used to calm down.  Pt identified speaking as a coping skill that helps ground her and get her feelings out.  Pt also explained talking to about positive things instead of the negative helps as well.     Plan: Continue to engage  patient in RT group sessions 2-3x/week.   Jsiah Menta-McCall, LRT,CTRS  12/11/2022 12:24 PM

## 2022-12-11 NOTE — Progress Notes (Signed)
   12/11/22 0600  15 Minute Checks  Location Dayroom  Visual Appearance Calm  Behavior Composed  Sleep (Behavioral Health Patients Only)  Calculate sleep? (Click Yes once per 24 hr at 0600 safety check) Yes  Documented sleep last 24 hours 4.5   Pt slept off and on. Pt still hard to redirect.

## 2022-12-11 NOTE — Progress Notes (Signed)
[facility] MD Progress Note  12/11/2022 4:22 PM Kara Nguyen  MRN:  [id number]  Reason for admission:  This the second psychiatric admission/treatments in this [facility] for this 22 year old AA female with hx of mental illness. Kara Nguyen was a patient in this [facility] last month (April, 2024) from 11-03-22 thru 11-09-22. She was stabilized & discharged on medications with an outpatient psychiatric appointments scheduled at the [facility] in [place], [place]. It is however, unsure at this time if patient was compliant with her outpatient psychiatric recommendations as she currently presents as a poor historian. She is being re-admitted to the [facility] this time around with complain of worsening psychosis & insomnia. And while at the [facility] ED, she was noted to have urinary tract infections after she became feverish. She received some IV fluids/antibiotic treatment & later discharged on oral antibiotics. After medical evaluation/stabilization/clearance, Kara Nguyen was transferred to the [facility] for further psychiatric evaluation & treatments.    Daily notes:  Kara Nguyen is seen in her room, chart reviewed. The chart findings discussed with treatment team. She presents alert, oriented to self. However, she remains disorganized, illogical & tangential with her statements. She is visible on the unit from her room to the hallway & back to her room. Patient is making some eye contacts today. When assessed yesterday, patient appeared pre-occupied internally. However today, patient still remains pre-occupied internally while mumbling incoherently. She reports during this evaluation, "I'm doing good. The night was pretty good. I was just thinking about God & how I was funcking things up". Patient continues from there to mumble incoherently with her eye closed". Patient at this time remains psychotic. She has been started on the medication regimen for her symptoms. Her medication doses are still being titrated. Staff will continue to work with  patient as she continues to improve in her symptoms. This case is discussed with the attending psychiatrist. See the treatment plan below. Patient's blood pressure & pulse rate are both elevated. There are no reported hx of high blood pressure or any kind of cardiac issues. Staff will recheck her vital signs again today. If both pulse/heart remain elevated by tomorrow, we will either initiate treatment or call medical consults. We continue current plan of care as already in progress. Patient is currently in no apparent distress.  Principal Problem: Schizophrenia spectrum disorder with psychotic disorder type not yet determined (HCC)  Diagnosis: Principal Problem:   Schizophrenia spectrum disorder with psychotic disorder type not yet determined (HCC) Active Problems:   Psychosis (HCC)  Total Time spent with patient:  35 minutes  Past Psychiatric History: See H&P  Past Medical History:  Past Medical History:  Diagnosis Date   Anxiety    Bipolar disorder (HCC)    Deliberate self-cutting    Depression    History reviewed. No pertinent surgical history. Family History:  Family History  Problem Relation Age of Onset   Cancer Other    Diabetes Paternal Grandmother    Family Psychiatric  History: See H&P.  Social History:  Social History   Substance and Sexual Activity  Alcohol Use Never   Alcohol/week: 0.0 standard drinks of alcohol     Social History   Substance and Sexual Activity  Drug Use Not Currently   Types: Solvent inhalants    Social History   Socioeconomic History   Marital status: Single    Spouse name: Not on file   Number of children: 0   Years of education: Not on file   Highest education  level: 10th grade  Occupational History   Occupation: Consulting civil engineer  Tobacco Use   Smoking status: Never   Smokeless tobacco: Never  Vaping Use   Vaping Use: Never used  Substance and Sexual Activity   Alcohol use: Never    Alcohol/week: 0.0 standard drinks of alcohol    Drug use: Not Currently    Types: Solvent inhalants   Sexual activity: Never  Other Topics Concern   Not on file  Social History Narrative   She lives with mother, father, and sibling.     Social Determinants of Health   Financial Resource Strain: Not on file  Food Insecurity: Patient Declined (12/09/2022)   Hunger Vital Sign    Worried About Running Out of Food in the Last Year: Patient declined    Ran Out of Food in the Last Year: Patient declined  Transportation Needs: No Transportation Needs (12/09/2022)   PRAPARE - Administrator, Civil Service (Medical): No    Lack of Transportation (Non-Medical): No  Physical Activity: Not on file  Stress: Not on file  Social Connections: Not on file   Additional Social History:   Sleep: Good  Appetite:  Fair  Current Medications: Current Facility-Administered Medications  Medication Dose Route Frequency Provider Last Rate Last Admin   acetaminophen (TYLENOL) tablet 650 mg  650 mg Oral Q6H PRN Starkes-Perry, Juel Burrow, FNP       alum & mag hydroxide-simeth (MAALOX/MYLANTA) 200-200-20 MG/5ML suspension 30 mL  30 mL Oral Q4H PRN Starkes-Perry, Juel Burrow, FNP       diphenhydrAMINE (BENADRYL) capsule 50 mg  50 mg Oral TID PRN Maryagnes Amos, FNP   50 mg at 12/10/22 2348   Or   diphenhydrAMINE (BENADRYL) injection 50 mg  50 mg Intramuscular TID PRN Maryagnes Amos, FNP   50 mg at 12/10/22 0201   haloperidol (HALDOL) tablet 5 mg  5 mg Oral TID PRN Maryagnes Amos, FNP   5 mg at 12/10/22 2348   Or   haloperidol lactate (HALDOL) injection 5 mg  5 mg Intramuscular TID PRN Maryagnes Amos, FNP   5 mg at 12/10/22 0202   hydrOXYzine (ATARAX) tablet 25 mg  25 mg Oral TID PRN Maryagnes Amos, FNP   25 mg at 12/10/22 2143   LORazepam (ATIVAN) tablet 2 mg  2 mg Oral TID PRN Maryagnes Amos, FNP   2 mg at 12/10/22 2348   Or   LORazepam (ATIVAN) injection 2 mg  2 mg Intramuscular TID PRN Maryagnes Amos, FNP   2 mg at 12/10/22 0202   magnesium hydroxide (MILK OF MAGNESIA) suspension 30 mL  30 mL Oral Daily PRN Maryagnes Amos, FNP       risperiDONE (RISPERDAL M-TABS) disintegrating tablet 1 mg  1 mg Oral Daily Massengill, Nathan, MD   1 mg at 12/11/22 4098   risperiDONE (RISPERDAL M-TABS) disintegrating tablet 2 mg  2 mg Oral QHS Massengill, Nathan, MD   2 mg at 12/10/22 2037   sulfamethoxazole-trimethoprim (BACTRIM DS) 800-160 MG per tablet 1 tablet  1 tablet Oral Q12H Maryagnes Amos, FNP   1 tablet at 12/11/22 0733   traZODone (DESYREL) tablet 100 mg  100 mg Oral QHS,MR X 1 Maryagnes Amos, FNP   100 mg at 12/10/22 2143   Lab Results: No results found for this or any previous visit (from the past 48 hour(s)).  Blood Alcohol level:  Lab Results  Component Value Date  ETH <10 12/06/2022   ETH <10 11/01/2022    Metabolic Disorder Labs: Lab Results  Component Value Date   HGBA1C 4.8 11/06/2022   MPG 91.06 11/06/2022   MPG 93.93 01/16/2018   Lab Results  Component Value Date   PROLACTIN 29.5 (H) 01/10/2018   Lab Results  Component Value Date   CHOL 118 11/05/2022   TRIG 59 11/05/2022   HDL 49 11/05/2022   CHOLHDL 2.4 11/05/2022   VLDL 12 11/05/2022   LDLCALC 57 11/05/2022   LDLCALC 55 01/16/2018    Physical Findings: AIMS:  , ,  ,  ,    CIWA:    COWS:     Musculoskeletal: Strength & Muscle Tone: within normal limits Gait & Station: normal Patient leans: N/A  Psychiatric Specialty Exam:  Presentation  General Appearance:  Disheveled (malodorous.)  Eye Contact: Other (comment) (None)  Speech: Slow; Other (comment) (non-spontaneous.)  Speech Volume: Decreased  Handedness: Right   Mood and Affect  Mood: -- (Psychotic.)  Affect: Blunt; Congruent   Thought Process  Thought Processes: Disorganized  Descriptions of Associations:Tangential  Orientation:Partial  Thought Content:Illogical; Tangential  History of  Schizophrenia/Schizoaffective disorder:Yes  Duration of Psychotic Symptoms:Greater than six months  Hallucinations:Hallucinations: -- (Patient appears to be internally pre-occupied.) Description of Auditory Hallucinations: Patient appears to be internally pre-occupied.  Ideas of Reference:Delusions  Suicidal Thoughts:Suicidal Thoughts: -- (Unable to assess at this time.)  Homicidal Thoughts:Homicidal Thoughts: -- (Unable to assess at this time.)   Sensorium  Memory: Immediate Poor; Recent Poor; Remote Poor  Judgment: Impaired  Insight: Lacking   Executive Functions  Concentration: Poor  Attention Span: Poor  Recall: Poor  Fund of Knowledge: Poor  Language: Fair   Psychomotor Activity  Psychomotor Activity: Psychomotor Activity: Decreased   Assets  Assets: Health and safety inspector; Housing; Social Support; Resilience  Sleep  Sleep: Sleep: Fair Number of Hours of Sleep: 5.5  Physical Exam: Physical Exam Vitals and nursing note reviewed.  HENT:     Mouth/Throat:     Pharynx: Oropharynx is clear.  Pulmonary:     Effort: Pulmonary effort is normal.  Genitourinary:    Comments: Deferred Musculoskeletal:        General: Normal range of motion.     Cervical back: Normal range of motion.  Skin:    General: Skin is warm.  Neurological:     Mental Status: She is alert.    Review of Systems  Constitutional:  Negative for chills and fever.  HENT:  Negative for congestion and sore throat.   Respiratory:  Negative for cough, shortness of breath and wheezing.   Cardiovascular:  Negative for chest pain and palpitations.  Gastrointestinal:  Negative for abdominal pain, constipation, diarrhea, heartburn, nausea and vomiting.  Musculoskeletal:  Negative for joint pain and myalgias.  Neurological:  Negative for dizziness, tingling, tremors, sensory change, speech change, focal weakness, seizures, loss of consciousness, weakness and headaches.   Psychiatric/Behavioral:  Positive for hallucinations. Negative for suicidal ideas. The patient is not nervous/anxious and does not have insomnia.    Blood pressure (!) 133/94, pulse (!) 123, temperature 98.2 F (36.8 C), temperature source Oral, resp. rate 20, height 5\' 9"  (1.753 m), weight 110.5 kg, SpO2 98 %. Body mass index is 35.97 kg/m.  Treatment Plan Summary: Daily contact with patient to assess and evaluate symptoms and progress in treatment and Medication management.   Continue inpatient hospitalization.  Will continue today 12/11/2022 plan as below except where it is noted.  Principal/active diagnoses.  Schizophrenia spectrum disorder with psychotic disorder type not yet determined.    Associated symptoms.  Psychosis.  Insomnia. Agitation.  Plan:  -Continue Risperdal M-tab 1 mg po daily for mood stabilization. -Changed  Risperdal M-tabs 2 mg po Q hs for mood control.  -Continue Trazodone 100 mg po Q hs prn, may repeat x 1 for insomnia.  -Continue Hydroxyzine 25 mg po tid prn for anxiety.    Agitation protocols: Cont as recommended;  -Benadryl 50 mg po or IM tid prn. -Haldol 5 mg po or IM tid prn.  -Lorazepam 2 mg po or IM tid prn.   Other medical issues.  -Continue Bactrim DS 800-160 po bid for uti x 6 doses.   Other PRNS -Continue Tylenol 650 mg every 6 hours PRN for mild pain -Continue Maalox 30 ml Q 4 hrs PRN for indigestion -Continue MOM 30 ml po Q 6 hrs for constipation   Safety and Monitoring: Voluntary admission to inpatient psychiatric unit for safety, stabilization and treatment Daily contact with patient to assess and evaluate symptoms and progress in treatment Patient's case to be discussed in multi-disciplinary team meeting Observation Level : q15 minute checks Vital signs: q12 hours Precautions: Safety   Discharge Planning: Social work and case management to assist with discharge planning and identification of hospital follow-up needs prior to  discharge Estimated LOS: 5-7 days Discharge Concerns: Need to establish a safety plan; Medication compliance and effectiveness Discharge Goals: Return home with outpatient referrals for mental health follow-up including medication management/psychotherapy  Armandina Stammer, NP, pmhnp, fnp-bc. 12/11/2022, 4:22 PM

## 2022-12-11 NOTE — BH IP Treatment Plan (Signed)
Interdisciplinary Treatment and Diagnostic Plan   12/11/2022 Time of Session: 1101 Kara Nguyen MRN: [id number]  Principal Diagnosis: Schizophrenia spectrum disorder with psychotic disorder type not yet determined [facility])  Secondary Diagnoses: Principal Problem:   Schizophrenia spectrum disorder with psychotic disorder type not yet determined (HCC) Active Problems:   Psychosis (HCC)   Current Medications:  Current Facility-Administered Medications  Medication Dose Route Frequency Provider Last Rate Last Admin   acetaminophen (TYLENOL) tablet 650 mg  650 mg Oral Q6H PRN Starkes-Perry, Juel Burrow, FNP       alum & mag hydroxide-simeth (MAALOX/MYLANTA) 200-200-20 MG/5ML suspension 30 mL  30 mL Oral Q4H PRN Starkes-Perry, Juel Burrow, FNP       diphenhydrAMINE (BENADRYL) capsule 50 mg  50 mg Oral TID PRN Maryagnes Amos, FNP   50 mg at 12/10/22 2348   Or   diphenhydrAMINE (BENADRYL) injection 50 mg  50 mg Intramuscular TID PRN Maryagnes Amos, FNP   50 mg at 12/10/22 0201   haloperidol (HALDOL) tablet 5 mg  5 mg Oral TID PRN Maryagnes Amos, FNP   5 mg at 12/10/22 2348   Or   haloperidol lactate (HALDOL) injection 5 mg  5 mg Intramuscular TID PRN Maryagnes Amos, FNP   5 mg at 12/10/22 0202   hydrOXYzine (ATARAX) tablet 25 mg  25 mg Oral TID PRN Maryagnes Amos, FNP   25 mg at 12/10/22 2143   LORazepam (ATIVAN) tablet 2 mg  2 mg Oral TID PRN Maryagnes Amos, FNP   2 mg at 12/10/22 2348   Or   LORazepam (ATIVAN) injection 2 mg  2 mg Intramuscular TID PRN Maryagnes Amos, FNP   2 mg at 12/10/22 0202   magnesium hydroxide (MILK OF MAGNESIA) suspension 30 mL  30 mL Oral Daily PRN Maryagnes Amos, FNP       risperiDONE (RISPERDAL M-TABS) disintegrating tablet 1 mg  1 mg Oral Daily Massengill, Nathan, MD   1 mg at 12/11/22 4098   risperiDONE (RISPERDAL M-TABS) disintegrating tablet 2 mg  2 mg Oral QHS Massengill, Harrold Donath, MD   2 mg at 12/10/22  2037   sulfamethoxazole-trimethoprim (BACTRIM DS) 800-160 MG per tablet 1 tablet  1 tablet Oral Q12H Maryagnes Amos, FNP   1 tablet at 12/11/22 1191   traZODone (DESYREL) tablet 100 mg  100 mg Oral QHS,MR X 1 Maryagnes Amos, FNP   100 mg at 12/10/22 2143   PTA Medications: Medications Prior to Admission  Medication Sig Dispense Refill Last Dose   hydrOXYzine (ATARAX) 25 MG tablet Take 1 tablet (25 mg total) by mouth 3 (three) times daily as needed for anxiety. 30 tablet 0    risperiDONE (RISPERDAL) 0.5 MG tablet Take 3 tablets (1.5 mg total) by mouth at bedtime. 90 tablet 0    sulfamethoxazole-trimethoprim (BACTRIM DS) 800-160 MG tablet Take 1 tablet by mouth every 12 (twelve) hours for 2 days. 4 tablet 0    traZODone (DESYREL) 100 MG tablet Take 1 tablet (100 mg total) by mouth at bedtime as needed for sleep. 30 tablet 0     Patient Stressors: Loss of relationship   Medication change or noncompliance    Patient Strengths: Religious Affiliation  Supportive family/friends   Treatment Modalities: Medication Management, Group therapy, Case management,  1 to 1 session with clinician, Psychoeducation, Recreational therapy.   Physician Treatment Plan for Primary Diagnosis: Schizophrenia spectrum disorder with psychotic disorder type not yet determined (HCC) Long Term Goal(s):  Improvement in symptoms so as ready for discharge   Short Term Goals: Ability to identify and develop effective coping behaviors will improve Ability to maintain clinical measurements within normal limits will improve Compliance with prescribed medications will improve Ability to identify changes in lifestyle to reduce recurrence of condition will improve Ability to verbalize feelings will improve Ability to disclose and discuss suicidal ideas Ability to demonstrate self-control will improve  Medication Management: Evaluate patient's response, side effects, and tolerance of medication  regimen.  Therapeutic Interventions: 1 to 1 sessions, Unit Group sessions and Medication administration.  Evaluation of Outcomes: Progressing  Physician Treatment Plan for Secondary Diagnosis: Principal Problem:   Schizophrenia spectrum disorder with psychotic disorder type not yet determined (HCC) Active Problems:   Psychosis (HCC)  Long Term Goal(s): Improvement in symptoms so as ready for discharge   Short Term Goals: Ability to identify and develop effective coping behaviors will improve Ability to maintain clinical measurements within normal limits will improve Compliance with prescribed medications will improve Ability to identify changes in lifestyle to reduce recurrence of condition will improve Ability to verbalize feelings will improve Ability to disclose and discuss suicidal ideas Ability to demonstrate self-control will improve     Medication Management: Evaluate patient's response, side effects, and tolerance of medication regimen.  Therapeutic Interventions: 1 to 1 sessions, Unit Group sessions and Medication administration.  Evaluation of Outcomes: Progressing   RN Treatment Plan for Primary Diagnosis: Schizophrenia spectrum disorder with psychotic disorder type not yet determined (HCC) Long Term Goal(s): Knowledge of disease and therapeutic regimen to maintain health will improve  Short Term Goals: Ability to remain free from injury will improve, Ability to verbalize frustration and anger appropriately will improve, Ability to demonstrate self-control, Ability to participate in decision making will improve, Ability to verbalize feelings will improve, Ability to disclose and discuss suicidal ideas, Ability to identify and develop effective coping behaviors will improve, and Compliance with prescribed medications will improve  Medication Management: RN will administer medications as ordered by provider, will assess and evaluate patient's response and provide education  to patient for prescribed medication. RN will report any adverse and/or side effects to prescribing provider.  Therapeutic Interventions: 1 on 1 counseling sessions, Psychoeducation, Medication administration, Evaluate responses to treatment, Monitor vital signs and CBGs as ordered, Perform/monitor CIWA, COWS, AIMS and Fall Risk screenings as ordered, Perform wound care treatments as ordered.  Evaluation of Outcomes: Progressing   LCSW Treatment Plan for Primary Diagnosis: Schizophrenia spectrum disorder with psychotic disorder type not yet determined (HCC) Long Term Goal(s): Safe transition to appropriate next level of care at discharge, Engage patient in therapeutic group addressing interpersonal concerns.  Short Term Goals: Engage patient in aftercare planning with referrals and resources, Increase social support, Increase ability to appropriately verbalize feelings, Increase emotional regulation, Facilitate acceptance of mental health diagnosis and concerns, Facilitate patient progression through stages of change regarding substance use diagnoses and concerns, Identify triggers associated with mental health/substance abuse issues, and Increase skills for wellness and recovery  Therapeutic Interventions: Assess for all discharge needs, 1 to 1 time with Social worker, Explore available resources and support systems, Assess for adequacy in community support network, Educate family and significant other(s) on suicide prevention, Complete Psychosocial Assessment, Interpersonal group therapy.  Evaluation of Outcomes: Progressing   Progress in Treatment: Attending groups: Yes. Participating in groups: No. Taking medication as prescribed: Yes. Toleration medication: Yes. Family/Significant other contact made: No will contact Family Member Patient understands diagnosis: Yes. Discussing patient identified  problems/goals with staff: Yes. Medical problems stabilized or resolved: Yes. Denies  suicidal/homicidal ideation: Yes. Issues/concerns per patient self-inventory: Yes. Other: N/A  New problem(s) identified: No, Describe:  None reported  New Short Term/Long Term Goal(s): medication stabilization, elimination of SI thoughts, development of comprehensive mental wellness plan.      Patient Goals:  Coping Skills  Discharge Plan or Barriers: Patient recently admitted. CSW will continue to follow and assess for appropriate referrals and possible discharge planning.    Reason for Continuation of Hospitalization: Delusions  Hallucinations Medication stabilization  Estimated Length of Stay: 3-7 Days  Last 3 [place] Suicide Severity Risk Score: Flowsheet Row [facility] (Current[facility] Most recent reading at 12/09/2022  4:15 PM ED to Hosp-Admission (Discharged) from 12/06/2022 in [facility] Most recent reading at 12/08/2022  6:37 PM ED from 12/06/2022 in [facility] Most recent reading at 12/06/2022  7:25 AM  C-SSRS RISK CATEGORY High Risk No Risk No Risk       Last PHQ 2/9 Scores:     No data to display          medication stabilization, elimination of SI thoughts, development of comprehensive mental wellness plan.    Scribe for Treatment Team: Ane Payment, LCSW 12/11/2022 11:00 AM

## 2022-12-11 NOTE — Progress Notes (Signed)
   12/11/22 2020  Psych Admission Type (Psych Patients Only)  Admission Status Involuntary  Psychosocial Assessment  Patient Complaints None  Eye Contact Staring  Facial Expression Flat;Pensive  Affect Preoccupied  Speech Soft;Tangential  Interaction Guarded  Motor Activity Slow  Appearance/Hygiene Unremarkable  Behavior Characteristics Cooperative;Pacing  Mood Preoccupied  Thought Process  Coherency Blocking  Content Preoccupation  Delusions None reported or observed  Perception Hallucinations  Hallucination None reported or observed  Judgment Impaired  Confusion None  Danger to Self  Current suicidal ideation? Denies  Agreement Not to Harm Self Yes  Description of Agreement Verbal contract for safety  Danger to Others  Danger to Others None reported or observed   Pt reports 0/10 anxiety and depression. Pt was offered support and encouragement. Pt required frequent redirection from staff to remain in room. Pt observed to be responding to internal stimuli and pacing. Pt was inappropriately laughing by herself. Pt was offered support and encouragement. Pt was given scheduled medications and PRN Hydroxyzine. Q 15 minute checks were done for safety. Pt has no complaints.Pt receptive to treatment and safety maintained on unit.

## 2022-12-11 NOTE — Progress Notes (Signed)
Patient still awake and waking up other patient's on the hall. Pt walked down to the quiet room where she can be redirected without waking other patients. Pt safe on unit.

## 2022-12-11 NOTE — [facility] Group Notes (Signed)
Spirituality group facilitated by Kathleen Argue, BCC.  Group Description: Group focused on topic of hope. Patients participated in facilitated discussion around topic, connecting with one another around experiences and definitions for hope. Group members engaged with visual explorer photos, reflecting on what hope looks like for them today. Group engaged in discussion around how their definitions of hope are present today in hospital.  Modalities: Psycho-social ed, Adlerian, Narrative, MI  Patient Progress: Kara Nguyen attended group and participated in group conversation as much as she was able.  She very much enjoyed the progressive muscle relaxation and got down on the floor and continued to stretch after we finished.

## 2022-12-11 NOTE — Progress Notes (Signed)
Patient denies SI, HI and AVH. Patient is noted to be on the floor crawling around and picking things off the floor. Patient is disorganized in thought and needs much redirection. Patient compliant with medications.   Assess patient for safety, offer medications as prescribed, engage patient in 1:1 staff talks. Patient required much staff redirection this shift and remains restricted to the unit.   Patient able to contract for safety. Continue to monitor as planned.

## 2022-12-11 NOTE — Progress Notes (Signed)
Pt will be made a unit restriction as she is awake and keeps trying the doors to the unit. Pt is being disruptive on unit right now. Trying to walk into other patient's rooms and waking them up.

## 2022-12-11 NOTE — Progress Notes (Signed)
Pt walked back to her room. Resistant to redirection in the quiet room. This writer sitting outside patient door to make sure she does not wander the hallways. Pt walking around in room making tangential statements.

## 2022-12-12 DIAGNOSIS — F29 Unspecified psychosis not due to a substance or known physiological condition: Secondary | ICD-10-CM | POA: Diagnosis not present

## 2022-12-12 NOTE — Group Note (Signed)
Date:  12/12/2022 Time:  9:20 PM  Group Topic/Focus:  Wrap-Up Group:   The focus of this group is to help patients review their daily goal of treatment and discuss progress on daily workbooks.    Participation Level:  Active  Participation Quality:  Appropriate  Affect:  Appropriate  Cognitive:  Appropriate  Insight: Appropriate  Engagement in Group:  Engaged  Modes of Intervention:  Education and Exploration  Additional Comments:  Patient attended and participated in group tonight. She reports that today she slept most of the day.  Lita Mains [facility] 12/12/2022, 9:20 PM

## 2022-12-12 NOTE — Group Note (Signed)
Date:  12/12/2022 Time:  9:30 AM  Group Topic/Focus:  Goals Group:   The focus of this group is to help patients establish daily goals to achieve during treatment and discuss how the patient can incorporate goal setting into their daily lives to aide in recovery. Orientation:   The focus of this group is to educate the patient on the purpose and policies of crisis stabilization and provide a format to answer questions about their admission.  The group details unit policies and expectations of patients while admitted.    Participation Level:  Minimal  Participation Quality:  Attentive  Affect:  Flat  Cognitive:  Lacking  Insight: Lacking  Engagement in Group:  Lacking  Modes of Intervention:  Limit-setting  Additional Comments: Did not participate.  Danilo Cappiello [facility] 12/12/2022, 9:30 AM

## 2022-12-12 NOTE — Progress Notes (Signed)
  Patient denies SI, HI and AVH. Patient is noted to be on the floor crawling around and picking things off the floor. Patient is disorganized in thought and needs much redirection. Patient compliant with medications.    Assess patient for safety, offer medications as prescribed, engage patient in 1:1 staff talks. Patient required much staff redirection this shift and remains restricted to the unit.    Patient able to contract for safety. Continue to monitor as planned

## 2022-12-12 NOTE — Progress Notes (Signed)
[facility] MD Progress Note  12/12/2022 3:35 PM Kara Nguyen  MRN:  [id number]  Reason for admission:  This the second psychiatric admission/treatments in this [facility] for this 22 year old AA female with hx of mental illness. Kara Nguyen was a patient in this [facility] last month (April, 2024) from 11-03-22 thru 11-09-22. She was stabilized & discharged on medications with an outpatient psychiatric appointments scheduled at the [facility] in [place], [place]. It is however, unsure at this time if patient was compliant with her outpatient psychiatric recommendations as she currently presents as a poor historian. She is being re-admitted to the [facility] this time around with complain of worsening psychosis & insomnia. And while at the [facility] ED, she was noted to have urinary tract infections after she became feverish. She received some IV fluids/antibiotic treatment & later discharged on oral antibiotics. After medical evaluation/stabilization/clearance, Kara Nguyen was transferred to the [facility] for further psychiatric evaluation & treatments.    Daily notes: [name] is seen in her room, chart reviewed. The chart findings discussed with the treatment team. She presents alert, oriented to self. However, she remains disorganized, illogical & tangential with her statements. She is visible on the unit from her room to the dayroom. Today, she presents bizarre. She only answers to her name. She mumbles incoherently with the assessment questions. She is unable to make a complete statement. She is only answering to her name.. She did state, "I'm depressed a little, caused by something". She is barely making any eye contact because her eyes are mostly closed. Whenever her eyes are  open, she stares into empty space. She moves very slowly & frequently walks into other people. Staff reports that Kristin is intrusive requiring frequent redirection. She is currently responding to some internal stimuli. The staff reports that from time to time, Larae  will knee down on the floor, seems like she was praying. At this time, the significant improvement noted is she is coming out of her room & being seen on the unit hallway & dayroom. There are no changes made on her current plan of care. Will continue as already in progress. Her vital signs remain stable with her pulse rate slightly elevated (105).  Principal Problem: Schizophrenia spectrum disorder with psychotic disorder type not yet determined (HCC)  Diagnosis: Principal Problem:   Schizophrenia spectrum disorder with psychotic disorder type not yet determined (HCC) Active Problems:   Psychosis (HCC)  Total Time spent with patient:  35 minutes  Past Psychiatric History: See H&P  Past Medical History:  Past Medical History:  Diagnosis Date   Anxiety    Bipolar disorder (HCC)    Deliberate self-cutting    Depression    History reviewed. No pertinent surgical history. Family History:  Family History  Problem Relation Age of Onset   Cancer Other    Diabetes Paternal Grandmother    Family Psychiatric  History: See H&P.  Social History:  Social History   Substance and Sexual Activity  Alcohol Use Never   Alcohol/week: 0.0 standard drinks of alcohol     Social History   Substance and Sexual Activity  Drug Use Not Currently   Types: Solvent inhalants    Social History   Socioeconomic History   Marital status: Single    Spouse name: Not on file   Number of children: 0   Years of education: Not on file   Highest education level: 10th grade  Occupational History   Occupation: Student  Tobacco Use   Smoking status:  Never   Smokeless tobacco: Never  Vaping Use   Vaping Use: Never used  Substance and Sexual Activity   Alcohol use: Never    Alcohol/week: 0.0 standard drinks of alcohol   Drug use: Not Currently    Types: Solvent inhalants   Sexual activity: Never  Other Topics Concern   Not on file  Social History Narrative   She lives with mother, father, and  sibling.     Social Determinants of Health   Financial Resource Strain: Not on file  Food Insecurity: Patient Declined (12/09/2022)   Hunger Vital Sign    Worried About Running Out of Food in the Last Year: Patient declined    Ran Out of Food in the Last Year: Patient declined  Transportation Needs: No Transportation Needs (12/09/2022)   PRAPARE - Administrator, Civil Service (Medical): No    Lack of Transportation (Non-Medical): No  Physical Activity: Not on file  Stress: Not on file  Social Connections: Not on file   Additional Social History:   Sleep: Good  Appetite:  Fair  Current Medications: Current Facility-Administered Medications  Medication Dose Route Frequency Provider Last Rate Last Admin   acetaminophen (TYLENOL) tablet 650 mg  650 mg Oral Q6H PRN Starkes-Perry, Juel Burrow, FNP       alum & mag hydroxide-simeth (MAALOX/MYLANTA) 200-200-20 MG/5ML suspension 30 mL  30 mL Oral Q4H PRN Starkes-Perry, Juel Burrow, FNP       diphenhydrAMINE (BENADRYL) capsule 50 mg  50 mg Oral TID PRN Maryagnes Amos, FNP   50 mg at 12/10/22 2348   Or   diphenhydrAMINE (BENADRYL) injection 50 mg  50 mg Intramuscular TID PRN Maryagnes Amos, FNP   50 mg at 12/10/22 0201   haloperidol (HALDOL) tablet 5 mg  5 mg Oral TID PRN Maryagnes Amos, FNP   5 mg at 12/10/22 2348   Or   haloperidol lactate (HALDOL) injection 5 mg  5 mg Intramuscular TID PRN Maryagnes Amos, FNP   5 mg at 12/10/22 0202   hydrOXYzine (ATARAX) tablet 25 mg  25 mg Oral TID PRN Maryagnes Amos, FNP   25 mg at 12/11/22 2142   LORazepam (ATIVAN) tablet 2 mg  2 mg Oral TID PRN Maryagnes Amos, FNP   2 mg at 12/10/22 2348   Or   LORazepam (ATIVAN) injection 2 mg  2 mg Intramuscular TID PRN Maryagnes Amos, FNP   2 mg at 12/10/22 0202   magnesium hydroxide (MILK OF MAGNESIA) suspension 30 mL  30 mL Oral Daily PRN Maryagnes Amos, FNP       risperiDONE (RISPERDAL M-TABS)  disintegrating tablet 1 mg  1 mg Oral Daily Massengill, Nathan, MD   1 mg at 12/12/22 0744   risperiDONE (RISPERDAL M-TABS) disintegrating tablet 2 mg  2 mg Oral QHS Massengill, Nathan, MD   2 mg at 12/11/22 2021   traZODone (DESYREL) tablet 100 mg  100 mg Oral QHS,MR X 1 Maryagnes Amos, FNP   100 mg at 12/11/22 2142   Lab Results: No results found for this or any previous visit (from the past 48 hour(s)).  Blood Alcohol level:  Lab Results  Component Value Date   [facility] <10 12/06/2022   ETH <10 11/01/2022    Metabolic Disorder Labs: Lab Results  Component Value Date   HGBA1C 4.8 11/06/2022   MPG 91.06 11/06/2022   MPG 93.93 01/16/2018   Lab Results  Component Value Date  PROLACTIN 29.5 (H) 01/10/2018   Lab Results  Component Value Date   CHOL 118 11/05/2022   TRIG 59 11/05/2022   HDL 49 11/05/2022   CHOLHDL 2.4 11/05/2022   VLDL 12 11/05/2022   LDLCALC 57 11/05/2022   LDLCALC 55 01/16/2018    Physical Findings: AIMS:  , ,  ,  ,    CIWA:    COWS:     Musculoskeletal: Strength & Muscle Tone: within normal limits Gait & Station: normal Patient leans: N/A  Psychiatric Specialty Exam:  Presentation  General Appearance:  Disheveled (malodorous.)  Eye Contact: Other (comment) (None)  Speech: Slow; Other (comment) (non-spontaneous.)  Speech Volume: Decreased  Handedness: Right   Mood and Affect  Mood: -- (Psychotic.)  Affect: Blunt; Congruent   Thought Process  Thought Processes: Disorganized  Descriptions of Associations:Tangential  Orientation:Partial  Thought Content:Illogical; Tangential  History of Schizophrenia/Schizoaffective disorder:Yes  Duration of Psychotic Symptoms:Greater than six months  Hallucinations:No data recorded  Ideas of Reference:Delusions  Suicidal Thoughts:No data recorded  Homicidal Thoughts:No data recorded   Sensorium  Memory: Immediate Poor; Recent Poor; Remote  Poor  Judgment: Impaired  Insight: Lacking   Executive Functions  Concentration: Poor  Attention Span: Poor  Recall: Poor  Fund of Knowledge: Poor  Language: Fair   Psychomotor Activity  Psychomotor Activity: No data recorded   Assets  Assets: Financial Resources/Insurance; Housing; Social Support; Resilience  Sleep  Sleep: No data recorded  Physical Exam: Physical Exam Vitals and nursing note reviewed.  HENT:     Mouth/Throat:     Pharynx: Oropharynx is clear.  Pulmonary:     Effort: Pulmonary effort is normal.  Genitourinary:    Comments: Deferred Musculoskeletal:        General: Normal range of motion.     Cervical back: Normal range of motion.  Skin:    General: Skin is warm.  Neurological:     Mental Status: She is alert.    Review of Systems  Constitutional:  Negative for chills and fever.  HENT:  Negative for congestion and sore throat.   Respiratory:  Negative for cough, shortness of breath and wheezing.   Cardiovascular:  Negative for chest pain and palpitations.  Gastrointestinal:  Negative for abdominal pain, constipation, diarrhea, heartburn, nausea and vomiting.  Musculoskeletal:  Negative for joint pain and myalgias.  Neurological:  Negative for dizziness, tingling, tremors, sensory change, speech change, focal weakness, seizures, loss of consciousness, weakness and headaches.  Psychiatric/Behavioral:  Positive for hallucinations. Negative for suicidal ideas. The patient is not nervous/anxious and does not have insomnia.    Blood pressure 112/76, pulse (!) 105, temperature 98.3 F (36.8 C), temperature source Oral, resp. rate 20, height 5\' 9"  (1.753 m), weight 110.5 kg, SpO2 100 %. Body mass index is 35.97 kg/m.  Treatment Plan Summary: Daily contact with patient to assess and evaluate symptoms and progress in treatment and Medication management.   Continue inpatient hospitalization.  Will continue today 12/12/2022 plan as below  except where it is noted.  Principal/active diagnoses.  Schizophrenia spectrum disorder with psychotic disorder type not yet determined.    Associated symptoms.  Psychosis.  Insomnia. Agitation.  Plan:  -Continue Risperdal M-tab 1 mg po daily for mood stabilization. -Continue  Risperdal M-tabs 2 mg po Q hs for mood control.  -Continue Trazodone 100 mg po Q hs prn, may repeat x 1 for insomnia.  -Continue Hydroxyzine 25 mg po tid prn for anxiety.    Agitation protocols: Cont as recommended;  -  Benadryl 50 mg po or IM tid prn. -Haldol 5 mg po or IM tid prn.  -Lorazepam 2 mg po or IM tid prn.   Other medical issues.  -Continue Bactrim DS 800-160 po bid for uti x 6 doses.   Other PRNS -Continue Tylenol 650 mg every 6 hours PRN for mild pain -Continue Maalox 30 ml Q 4 hrs PRN for indigestion -Continue MOM 30 ml po Q 6 hrs for constipation   Safety and Monitoring: Voluntary admission to inpatient psychiatric unit for safety, stabilization and treatment Daily contact with patient to assess and evaluate symptoms and progress in treatment Patient's case to be discussed in multi-disciplinary team meeting Observation Level : q15 minute checks Vital signs: q12 hours Precautions: Safety   Discharge Planning: Social work and case management to assist with discharge planning and identification of hospital follow-up needs prior to discharge Estimated LOS: 5-7 days Discharge Concerns: Need to establish a safety plan; Medication compliance and effectiveness Discharge Goals: Return home with outpatient referrals for mental health follow-up including medication management/psychotherapy  Armandina Stammer, NP, pmhnp, fnp-bc. 12/12/2022, 3:35 PMPatient ID: Kara Nguyen, female   DOB: 04-14-2001, 21 y.o.   MRN: [id number]

## 2022-12-12 NOTE — Progress Notes (Signed)
   12/12/22 0603  15 Minute Checks  Location Dayroom  Visual Appearance Calm  Behavior Composed  Sleep (Behavioral Health Patients Only)  Calculate sleep? (Click Yes once per 24 hr at 0600 safety check) Yes  Documented sleep last 24 hours 2.5

## 2022-12-13 LAB — CULTURE, BLOOD (ROUTINE X 2)
Culture: NO GROWTH
Special Requests: ADEQUATE
Special Requests: ADEQUATE

## 2022-12-13 MED ORDER — HYDROXYZINE HCL 50 MG PO TABS
50.0000 mg | ORAL_TABLET | Freq: Three times a day (TID) | ORAL | Status: DC | PRN
  Administered 2022-12-13 – 2022-12-14 (×2): 50 mg via ORAL
  Filled 2022-12-13 (×2): qty 1

## 2022-12-13 MED ORDER — HYDROXYZINE HCL 50 MG PO TABS
50.0000 mg | ORAL_TABLET | Freq: Once | ORAL | Status: DC
  Filled 2022-12-13: qty 1

## 2022-12-13 NOTE — Progress Notes (Signed)
[facility] MD Progress Note  12/13/2022 5:12 PM Kara Nguyen  MRN:  [id number]  Reason for admission:  This the second psychiatric admission/treatments in this Kara Nguyen for this 22 year old AA female with hx of mental illness. Kara Nguyen was a patient in this Kara Nguyen last month (April, 2024) from 11-03-22 thru 11-09-22. She was stabilized & discharged on medications with an outpatient psychiatric appointments scheduled at the [facility] in [place], [place]. It is however, unsure at this time if patient was compliant with her outpatient psychiatric recommendations as she currently presents as a poor historian. She is being re-admitted to the [facility] this time around with complain of worsening psychosis & insomnia. And while at the [facility] ED, she was noted to have urinary tract infections after she became feverish. She received some IV fluids/antibiotic treatment & later discharged on oral antibiotics. After medical evaluation/stabilization/clearance, Kara Nguyen was transferred to the [facility] for further psychiatric evaluation & treatments.    Daily notes: [name] is seen in her room, chart reviewed. The chart findings discussed with the treatment team. She presents alert, oriented & aware of situation. She was in the dayroom with the other patients, but decided to go back to her room to rest. [name] during this evaluation presents with a good affect, making a good eye contact & verbally responsive. For the first time since being in this Nguyen, [name] presents  civil, proper & making sense. She reports, "My mood is good, but elevated this morning. That means I have some anxiety & restlessness. But I can handle both. I attended the morning group, but I have to come back to my room because I feel tired. There is nothing going on in the day room at the moment, so I came back to my room to rest.  I was afraid yesterday because I did not know what was going on with me. There is some depression going on with me yesterday, but after my  dad visited me, I felt better. It was pretty good too. I'm still hearing the voices, but it is all about God this time". However, this evening, Kara Nguyen is observed pacing in the day room. She tries to speak, it will come out as if she was trying spell it. Father requested a call from [place] [facility] providers to discuss her medications & how she is doing. This provider called & explained to the father & Justa's brother how patient is doing & the reasons behind the Md's decision to put Kara Nguyen on Risperdal. And this is to transition her to the monthly injectable format of the Risperdal by discharge to help promote consistency in medication taking for symptoms resolution. Father says Wardine may be mentioning God quite a bit because he asked her to pray often because they are catholic. The son acts as the Hispanic interpreter between this provider & Kara Nguyen's father. Kara Nguyen's father asked for the social worker to see if there is any socialization group or class to enroll Kara Nguyen in after discharge. There are no changes made on the current plan of care. Continue as already in progress. Kara Nguyen did receive prn medications for her pacing.  Principal Problem: Schizophrenia spectrum disorder with psychotic disorder type not yet determined (HCC)  Diagnosis: Principal Problem:   Schizophrenia spectrum disorder with psychotic disorder type not yet determined (HCC) Active Problems:   Psychosis (HCC)  Total Time spent with patient:  35 minutes  Past Psychiatric History: See H&P  Past Medical History:  Past Medical History:  Diagnosis Date   Anxiety  Bipolar disorder (HCC)    Deliberate self-cutting    Depression    History reviewed. No pertinent surgical history. Family History:  Family History  Problem Relation Age of Onset   Cancer Other    Diabetes Paternal Grandmother    Family Psychiatric  History: See H&P.  Social History:  Social History   Substance and Sexual Activity  Alcohol Use Never    Alcohol/week: 0.0 standard drinks of alcohol     Social History   Substance and Sexual Activity  Drug Use Not Currently   Types: Solvent inhalants    Social History   Socioeconomic History   Marital status: Single    Spouse name: Not on file   Number of children: 0   Years of education: Not on file   Highest education level: 10th grade  Occupational History   Occupation: Consulting civil engineer  Tobacco Use   Smoking status: Never   Smokeless tobacco: Never  Vaping Use   Vaping Use: Never used  Substance and Sexual Activity   Alcohol use: Never    Alcohol/week: 0.0 standard drinks of alcohol   Drug use: Not Currently    Types: Solvent inhalants   Sexual activity: Never  Other Topics Concern   Not on file  Social History Narrative   She lives with mother, father, and sibling.     Social Determinants of Health   Financial Resource Strain: Not on file  Food Insecurity: Patient Declined (12/09/2022)   Hunger Vital Sign    Worried About Running Out of Food in the Last Year: Patient declined    Ran Out of Food in the Last Year: Patient declined  Transportation Needs: No Transportation Needs (12/09/2022)   PRAPARE - Administrator, Civil Service (Medical): No    Lack of Transportation (Non-Medical): No  Physical Activity: Not on file  Stress: Not on file  Social Connections: Not on file   Additional Social History:   Sleep: Good  Appetite:  Fair  Current Medications: Current Facility-Administered Medications  Medication Dose Route Frequency Provider Last Rate Last Admin   acetaminophen (TYLENOL) tablet 650 mg  650 mg Oral Q6H PRN Starkes-Perry, Juel Burrow, FNP       alum & mag hydroxide-simeth (MAALOX/MYLANTA) 200-200-20 MG/5ML suspension 30 mL  30 mL Oral Q4H PRN Starkes-Perry, Juel Burrow, FNP       diphenhydrAMINE (BENADRYL) capsule 50 mg  50 mg Oral TID PRN Maryagnes Amos, FNP   50 mg at 12/10/22 2348   Or   diphenhydrAMINE (BENADRYL) injection 50 mg  50 mg  Intramuscular TID PRN Maryagnes Amos, FNP   50 mg at 12/10/22 0201   haloperidol (HALDOL) tablet 5 mg  5 mg Oral TID PRN Maryagnes Amos, FNP   5 mg at 12/10/22 2348   Or   haloperidol lactate (HALDOL) injection 5 mg  5 mg Intramuscular TID PRN Maryagnes Amos, FNP   5 mg at 12/10/22 0202   hydrOXYzine (ATARAX) tablet 50 mg  50 mg Oral Once Armandina Stammer I, NP       hydrOXYzine (ATARAX) tablet 50 mg  50 mg Oral TID PRN Armandina Stammer I, NP       LORazepam (ATIVAN) tablet 2 mg  2 mg Oral TID PRN Maryagnes Amos, FNP   2 mg at 12/13/22 1650   Or   LORazepam (ATIVAN) injection 2 mg  2 mg Intramuscular TID PRN Maryagnes Amos, FNP   2 mg at 12/10/22 0202  magnesium hydroxide (MILK OF MAGNESIA) suspension 30 mL  30 mL Oral Daily PRN Starkes-Perry, Juel Burrow, FNP       risperiDONE (RISPERDAL M-TABS) disintegrating tablet 1 mg  1 mg Oral Daily Massengill, Nathan, MD   1 mg at 12/13/22 0732   risperiDONE (RISPERDAL M-TABS) disintegrating tablet 2 mg  2 mg Oral QHS Massengill, Nathan, MD   2 mg at 12/12/22 2106   traZODone (DESYREL) tablet 100 mg  100 mg Oral QHS,MR X 1 Maryagnes Amos, FNP   100 mg at 12/12/22 2105   Lab Results: No results found for this or any previous visit (from the past 48 hour(s)).  Blood Alcohol level:  Lab Results  Component Value Date   ETH <10 12/06/2022   ETH <10 11/01/2022    Metabolic Disorder Labs: Lab Results  Component Value Date   HGBA1C 4.8 11/06/2022   MPG 91.06 11/06/2022   MPG 93.93 01/16/2018   Lab Results  Component Value Date   PROLACTIN 29.5 (H) 01/10/2018   Lab Results  Component Value Date   CHOL 118 11/05/2022   TRIG 59 11/05/2022   HDL 49 11/05/2022   CHOLHDL 2.4 11/05/2022   VLDL 12 11/05/2022   LDLCALC 57 11/05/2022   LDLCALC 55 01/16/2018    Physical Findings: AIMS:  , ,  ,  ,    CIWA:    COWS:     Musculoskeletal: Strength & Muscle Tone: within normal limits Gait & Station:  normal Patient leans: N/A  Psychiatric Specialty Exam:  Presentation  General Appearance:  Casual; Disheveled  Eye Contact: Fair  Speech: Other (comment) (Mumbling)  Speech Volume: Decreased  Handedness: Right   Mood and Affect  Mood: Anxious (restless)  Affect: Congruent   Thought Process  Thought Processes: Disorganized  Descriptions of Associations:Tangential  Orientation:Full (Time, Place and Person)  Thought Content:Illogical  History of Schizophrenia/Schizoaffective disorder:Yes  Duration of Psychotic Symptoms:Greater than six months  Hallucinations:Hallucinations: -- (Responding to internal stimuli.) Description of Auditory Hallucinations: Unsure   Ideas of Reference:None  Suicidal Thoughts:Suicidal Thoughts: No   Homicidal Thoughts:Homicidal Thoughts: No    Sensorium  Memory: Immediate Fair; Recent Fair  Judgment: Impaired  Insight: Present   Executive Functions  Concentration: Poor  Attention Span: Poor  Recall: Fair  Fund of Knowledge: Poor  Language: Fair   Psychomotor Activity  Psychomotor Activity: Psychomotor Activity: Restlessness; Increased   Assets  Assets: Desire for Improvement; Financial Resources/Insurance; Housing; Social Support; Resilience  Sleep  Sleep: Sleep: Good Number of Hours of Sleep: 8   Physical Exam: Physical Exam Vitals and nursing note reviewed.  HENT:     Mouth/Throat:     Pharynx: Oropharynx is clear.  Pulmonary:     Effort: Pulmonary effort is normal.  Genitourinary:    Comments: Deferred Musculoskeletal:        General: Normal range of motion.     Cervical back: Normal range of motion.  Skin:    General: Skin is warm.  Neurological:     General: No focal deficit present.     Mental Status: She is alert and oriented to person, place, and time.    Review of Systems  Constitutional:  Negative for chills and fever.  HENT:  Negative for congestion and sore  throat.   Respiratory:  Negative for cough, shortness of breath and wheezing.   Cardiovascular:  Negative for chest pain and palpitations.  Gastrointestinal:  Negative for abdominal pain, constipation, diarrhea, heartburn, nausea and vomiting.  Musculoskeletal:  Negative for joint pain and myalgias.  Neurological:  Negative for dizziness, tingling, tremors, sensory change, speech change, focal weakness, seizures, loss of consciousness, weakness and headaches.  Psychiatric/Behavioral:  Positive for hallucinations. Negative for suicidal ideas. The patient is not nervous/anxious and does not have insomnia.    Blood pressure 126/79, pulse (!) 136, temperature 100.1 F (37.8 C), temperature source Oral, resp. rate 20, height 5\' 9"  (1.753 m), weight 110.5 kg, SpO2 99 %. Body mass index is 35.97 kg/m.  Treatment Plan Summary: Daily contact with patient to assess and evaluate symptoms and progress in treatment and Medication management.   Continue inpatient hospitalization.  Will continue today 12/13/2022 plan as below except where it is noted.   Principal/active diagnoses.  Schizophrenia spectrum disorder with psychotic disorder type not yet determined.    Associated symptoms.  Psychosis.  Insomnia. Agitation.  Plan:  -Continue Risperdal M-tab 1 mg po daily for mood stabilization. -Continue  Risperdal M-tabs 2 mg po Q hs for mood control.  -Continue Trazodone 100 mg po Q hs prn, may repeat x 1 for insomnia.  -Continue Hydroxyzine 25 mg po tid prn for anxiety.    Agitation protocols: Cont as recommended;  -Benadryl 50 mg po or IM tid prn. -Haldol 5 mg po or IM tid prn.  -Lorazepam 2 mg po or IM tid prn.   Other medical issues.  -Continue Bactrim DS 800-160 po bid for uti x 6 doses.   Other PRNS -Continue Tylenol 650 mg every 6 hours PRN for mild pain -Continue Maalox 30 ml Q 4 hrs PRN for indigestion -Continue MOM 30 ml po Q 6 hrs for constipation   Safety and  Monitoring: Voluntary admission to inpatient psychiatric unit for safety, stabilization and treatment Daily contact with patient to assess and evaluate symptoms and progress in treatment Patient's case to be discussed in multi-disciplinary team meeting Observation Level : q15 minute checks Vital signs: q12 hours Precautions: Safety   Discharge Planning: Social work and case management to assist with discharge planning and identification of Nguyen follow-up needs prior to discharge Estimated LOS: 5-7 days Discharge Concerns: Need to establish a safety plan; Medication compliance and effectiveness Discharge Goals: Return home with outpatient referrals for mental health follow-up including medication management/psychotherapy  Armandina Stammer, NP, pmhnp, fnp-bc. 12/13/2022, 5:12 PMPatient ID: Shirline Frees, female   DOB: [birth year], 21 y.o.   MRN: [id number] Patient ID: Meylin Visone, female   DOB: 14-Apr-2001, 21 y.o.   MRN: [id number]

## 2022-12-13 NOTE — Progress Notes (Addendum)
Patient given atarax po prn due to verbalized anxiety. At 1650, Patient appearing restless, shaking, and pacing up and down the hall stating "Im scared." "Can I erase my name off the board?" When asked why, patient just stated "Im scared." "Im very very anxious!" Patient observed going in and out of dayroom and unable to sit down. Patient given ativan po prn due to continued increasing anxiety and restlessness.

## 2022-12-13 NOTE — [facility] Group Notes (Signed)
[facility] Group Notes:  (Nursing/MHT/Case Management/Adjunct)  Date:  12/13/2022  Time:  8:45 PM  Type of Therapy:   Wrap-up group  Participation Level:  Active  Participation Quality:  Appropriate  Affect:  Appropriate  Cognitive:  Appropriate  Insight:  Appropriate  Engagement in Group:  Engaged  Modes of Intervention:  Education  Summary of Progress/Problems: Pt goal to stay positive, Day was 0/10. Staff encouraged her to work on positive coping skills.   Noah Delaine 12/13/2022, 8:45 PM

## 2022-12-13 NOTE — Progress Notes (Signed)
   12/12/22 2020  Psych Admission Type (Psych Patients Only)  Admission Status Involuntary  Psychosocial Assessment  Patient Complaints None  Eye Contact Fair  Facial Expression Flat  Affect Appropriate to circumstance  Speech Soft  Interaction Guarded  Motor Activity Slow  Appearance/Hygiene Unremarkable  Behavior Characteristics Cooperative  Mood Preoccupied  Thought Process  Coherency Tangential;Blocking  Content Preoccupation  Delusions None reported or observed  Perception Hallucinations  Hallucination None reported or observed  Judgment Poor  Confusion None  Danger to Self  Current suicidal ideation? Denies  Danger to Others  Danger to Others None reported or observed

## 2022-12-13 NOTE — Progress Notes (Signed)
Patient denies SI, HI and AVH. Patient is noted to be actively engaged in groups, compliant with medications and has had no behavioral dyscontrol.    Assess patient for safety, offer medications as prescribed, engage patient in 1:1 staff talks. Patient has required limited need for redirection this shift and has had a decrease in bizarre behaviors.     Patient able to contract for safety. Continue to monitor as planned

## 2022-12-13 NOTE — Progress Notes (Signed)
Patient came to nursing station reporting that she was having difficulty sleeping and she received her repeat of trazodone and vistaril. Writer asked if anything was bothering her and she reported that she was trying to solve the problems of the world. Writer encouraged her to try and lay down and allow the medication to help her relax so she can get some rest. She had her eyes closed during our conversation and Clinical research associate reminded her to walk back to her room with her eyes open. Writer adjusted the temperature to cool as she requested. Safety maintained with 15 min checks.

## 2022-12-14 ENCOUNTER — Other Ambulatory Visit (HOSPITAL_COMMUNITY): Payer: Self-pay

## 2022-12-14 MED ORDER — SERTRALINE HCL 25 MG PO TABS
25.0000 mg | ORAL_TABLET | Freq: Every day | ORAL | Status: AC
  Administered 2022-12-14: 25 mg via ORAL
  Filled 2022-12-14: qty 1

## 2022-12-14 MED ORDER — PROPRANOLOL HCL 10 MG PO TABS
10.0000 mg | ORAL_TABLET | Freq: Two times a day (BID) | ORAL | Status: DC
  Administered 2022-12-14 – 2022-12-15 (×3): 10 mg via ORAL
  Filled 2022-12-14 (×6): qty 1

## 2022-12-14 MED ORDER — SERTRALINE HCL 50 MG PO TABS
50.0000 mg | ORAL_TABLET | Freq: Every day | ORAL | Status: DC
  Administered 2022-12-15 – 2022-12-18 (×4): 50 mg via ORAL
  Filled 2022-12-14 (×7): qty 1

## 2022-12-14 MED ORDER — RISPERIDONE 2 MG PO TBDP
2.0000 mg | ORAL_TABLET | Freq: Every day | ORAL | Status: DC
  Administered 2022-12-15 – 2022-12-17 (×3): 2 mg via ORAL
  Filled 2022-12-14 (×5): qty 1

## 2022-12-14 NOTE — Progress Notes (Signed)
D- Patient alert and oriented. Denies SI, HI, VH, and pain. Endorses AH and "racing thoughts." States that she was depressed and experienced SI earlier in the day due to the racing thoughts. Urine specimen cup given to patient and she provided sample.  A- Scheduled medications administered to patient, per MAR. Support and encouragement provided.  Routine safety checks conducted every 15 minutes.  Patient informed to notify staff with problems or concerns.  R- No adverse drug reactions noted. Patient contracts for safety at this time. Patient compliant with medications and treatment plan. Patient receptive, calm, and cooperative. Patient interacts well with others on the unit.  Patient remains safe at this time.

## 2022-12-14 NOTE — Progress Notes (Signed)
[facility] MD Progress Note  12/14/2022 6:05 PM Kara Nguyen  MRN:  [id number]  Reason for admission:  This the second psychiatric admission/treatments in this [facility] for this 22 year old AA female with hx of mental illness. Kara Nguyen was a patient in this [facility] last month (April, 2024) from 11-03-22 thru 11-09-22. She was stabilized & discharged on medications with an outpatient psychiatric appointments scheduled at the [facility] in [place], [place]. It is however, unsure at this time if patient was compliant with her outpatient psychiatric recommendations as she currently presents as a poor historian. She is being re-admitted to the [facility] this time around with complain of worsening psychosis & insomnia. And while at the [facility] ED, she was noted to have urinary tract infections after she became feverish. She received some IV fluids/antibiotic treatment & later discharged on oral antibiotics. After medical evaluation/stabilization/clearance, Kara Nguyen was transferred to the [facility] for further psychiatric evaluation & treatments.    24 hr chart review: Sleep Hours last night: Received repeat dose of Trazodone and Hydroxyzine last night as per nurse documentation due to inability to sleep.  Stated to nursing staff at the time that she was "trying to solve the problems of the world." Nursing Concerns: As per patient's assigned RN, patient was also pacing and sleep walking in the hallway last night requiring redirections to her room multiple times, was intrusive and going into other people's rooms as well. Behavioral episodes in the past 24 hrs: As above Medication Compliance: Compliant Vital Signs in the past 24 hrs: Elevations in heart rate earlier today morning at 131.  BP elevated this afternoon at 140/82. PRN Medications in the past 24 hrs: Hydroxyzine, Ativan, yesterday evening.  Trazodone overnight.  Patient assessment note: During encounter today, mood is depressed, and affect is congruent.  Patient answers  questions with nonlinear responses.  Presentation is bizarre.  Patient reports that she is "feeling scared", when asked to talk more about why she is feeling scared she states that she is hearing her parents' voices.  Patient asked if parents have been coming to see her and she states "yes", but adds that she is hearing the voices of her parents even when they are not here at the hospital.  She states parents are telling her in Spanish "she gets to. fearful easily".  Patient tells Clinical research associate this in [place], then translated into [place].  Patient reports having visual hallucinations, when asked what she is seeing, she states "I think you know".  Patient presents with paranoia and delusions of persecution, states that she is fearful that her parents are out to harm her.  When asked why her parents would be out to harm her, she states "because I am stupid".  Patient is asked if her parents have ever told her this, but she denies it.   Patient denies SI, but presents with +HI. She reports homicidal ideations towards her brother, but is unable to tell writer why she wants to harm him. She states "God first. That's how it just keeps spinning and I keep being the same person", in response to being asked why she wants to kill her brother.  Pt reports that sleep last night was good, reports a good appetite, denies being in any physical pain.  Denies medication related side effects.No TD/EPS type symptoms found on assessment, and pt denies any feelings of stiffness. AIMS: 0.  We are increasing Risperdal from 1 mg in the morning and 2 mg QHS to 2 mg twice daily, and  started Inderal 10 mg BID for tachycardia earlier today morning. Zoloft also started at 25 mg today and increasing dose to 50 mg daily starting 6/4 for management of depressive symptoms. Continuing other medications as noted below and will continue to monitor.  Repeating UA & Urine culture to ascertain if UTI has resolved.   Principal Problem: Schizophrenia  spectrum disorder with psychotic disorder type not yet determined (HCC)  Diagnosis: Principal Problem:   Schizophrenia spectrum disorder with psychotic disorder type not yet determined (HCC) Active Problems:   Psychosis (HCC)  Total Time spent with patient:  35 minutes  Past Psychiatric History: See H&P  Past Medical History:  Past Medical History:  Diagnosis Date   Anxiety    Bipolar disorder (HCC)    Deliberate self-cutting    Depression    History reviewed. No pertinent surgical history. Family History:  Family History  Problem Relation Age of Onset   Cancer Other    Diabetes Paternal Grandmother    Family Psychiatric  History: See H&P.  Social History:  Social History   Substance and Sexual Activity  Alcohol Use Never   Alcohol/week: 0.0 standard drinks of alcohol     Social History   Substance and Sexual Activity  Drug Use Not Currently   Types: Solvent inhalants    Social History   Socioeconomic History   Marital status: Single    Spouse name: Not on file   Number of children: 0   Years of education: Not on file   Highest education level: 10th grade  Occupational History   Occupation: Consulting civil engineer  Tobacco Use   Smoking status: Never   Smokeless tobacco: Never  Vaping Use   Vaping Use: Never used  Substance and Sexual Activity   Alcohol use: Never    Alcohol/week: 0.0 standard drinks of alcohol   Drug use: Not Currently    Types: Solvent inhalants   Sexual activity: Never  Other Topics Concern   Not on file  Social History Narrative   She lives with mother, father, and sibling.     Social Determinants of Health   Financial Resource Strain: Not on file  Food Insecurity: Patient Declined (12/09/2022)   Hunger Vital Sign    Worried About Running Out of Food in the Last Year: Patient declined    Ran Out of Food in the Last Year: Patient declined  Transportation Needs: No Transportation Needs (12/09/2022)   PRAPARE - Scientist, research (physical sciences) (Medical): No    Lack of Transportation (Non-Medical): No  Physical Activity: Not on file  Stress: Not on file  Social Connections: Not on file   Additional Social History:   Sleep: Good  Appetite:  Fair  Current Medications: Current Facility-Administered Medications  Medication Dose Route Frequency Provider Last Rate Last Admin   acetaminophen (TYLENOL) tablet 650 mg  650 mg Oral Q6H PRN Starkes-Perry, Juel Burrow, FNP       alum & mag hydroxide-simeth (MAALOX/MYLANTA) 200-200-20 MG/5ML suspension 30 mL  30 mL Oral Q4H PRN Starkes-Perry, Juel Burrow, FNP       diphenhydrAMINE (BENADRYL) capsule 50 mg  50 mg Oral TID PRN Maryagnes Amos, FNP   50 mg at 12/10/22 2348   Or   diphenhydrAMINE (BENADRYL) injection 50 mg  50 mg Intramuscular TID PRN Maryagnes Amos, FNP   50 mg at 12/10/22 0201   haloperidol (HALDOL) tablet 5 mg  5 mg Oral TID PRN Maryagnes Amos, FNP   5  mg at 12/10/22 2348   Or   haloperidol lactate (HALDOL) injection 5 mg  5 mg Intramuscular TID PRN Maryagnes Amos, FNP   5 mg at 12/10/22 0202   hydrOXYzine (ATARAX) tablet 50 mg  50 mg Oral Once Armandina Stammer I, NP       hydrOXYzine (ATARAX) tablet 50 mg  50 mg Oral TID PRN Armandina Stammer I, NP   50 mg at 12/13/22 2247   LORazepam (ATIVAN) tablet 2 mg  2 mg Oral TID PRN Maryagnes Amos, FNP   2 mg at 12/13/22 1650   Or   LORazepam (ATIVAN) injection 2 mg  2 mg Intramuscular TID PRN Maryagnes Amos, FNP   2 mg at 12/10/22 0202   magnesium hydroxide (MILK OF MAGNESIA) suspension 30 mL  30 mL Oral Daily PRN Maryagnes Amos, FNP       propranolol (INDERAL) tablet 10 mg  10 mg Oral Q12H Massengill, Harrold Donath, MD   10 mg at 12/14/22 1156   risperiDONE (RISPERDAL M-TABS) disintegrating tablet 2 mg  2 mg Oral QHS Massengill, Nathan, MD   2 mg at 12/13/22 2051   [START ON 12/15/2022] risperiDONE (RISPERDAL M-TABS) disintegrating tablet 2 mg  2 mg Oral Daily Massengill, Harrold Donath, MD        Melene Muller ON 12/15/2022] sertraline (ZOLOFT) tablet 50 mg  50 mg Oral Daily Massengill, Nathan, MD       traZODone (DESYREL) tablet 100 mg  100 mg Oral QHS,MR X 1 Starkes-Perry, Takia S, FNP   100 mg at 12/13/22 2247   Lab Results: No results found for this or any previous visit (from the past 48 hour(s)).  Blood Alcohol level:  Lab Results  Component Value Date   ETH <10 12/06/2022   ETH <10 11/01/2022    Metabolic Disorder Labs: Lab Results  Component Value Date   HGBA1C 4.8 11/06/2022   MPG 91.06 11/06/2022   MPG 93.93 01/16/2018   Lab Results  Component Value Date   PROLACTIN 29.5 (H) 01/10/2018   Lab Results  Component Value Date   CHOL 118 11/05/2022   TRIG 59 11/05/2022   HDL 49 11/05/2022   CHOLHDL 2.4 11/05/2022   VLDL 12 11/05/2022   LDLCALC 57 11/05/2022   LDLCALC 55 01/16/2018    Physical Findings: AIMS:  , ,  ,  ,    CIWA:    COWS:     Musculoskeletal: Strength & Muscle Tone: within normal limits Gait & Station: normal Patient leans: N/A  Psychiatric Specialty Exam:  Presentation  General Appearance:  Fairly Groomed  Eye Contact: Fair  Speech: Clear and Coherent  Speech Volume: Decreased  Handedness: Right   Mood and Affect  Mood: Depressed  Affect: Congruent   Thought Process  Thought Processes: Disorganized  Descriptions of Associations:Tangential  Orientation:Partial  Thought Content:Perseveration; Illogical  History of Schizophrenia/Schizoaffective disorder:Yes  Duration of Psychotic Symptoms:Greater than six months  Hallucinations:Hallucinations: Auditory; Visual Description of Auditory Hallucinations: Unsure   Ideas of Reference:Paranoia; Delusions; Percusatory  Suicidal Thoughts:Suicidal Thoughts: No   Homicidal Thoughts:Homicidal Thoughts: No    Sensorium  Memory: Immediate Good  Judgment: Poor  Insight: Poor   Executive Functions  Concentration: Fair  Attention  Span: Poor  Recall: Poor  Fund of Knowledge: Poor  Language: Fair   Psychomotor Activity  Psychomotor Activity: Psychomotor Activity: Normal   Assets  Assets: Resilience  Sleep  Sleep: Sleep: Poor Number of Hours of Sleep: 8   Physical Exam: Physical Exam  Vitals and nursing note reviewed.  HENT:     Mouth/Throat:     Pharynx: Oropharynx is clear.  Pulmonary:     Effort: Pulmonary effort is normal.  Genitourinary:    Comments: Deferred Musculoskeletal:        General: Normal range of motion.     Cervical back: Normal range of motion.  Skin:    General: Skin is warm.  Neurological:     General: No focal deficit present.     Mental Status: She is alert and oriented to person, place, and time.    Review of Systems  Constitutional:  Negative for chills and fever.  HENT:  Negative for congestion and sore throat.   Respiratory:  Negative for cough, shortness of breath and wheezing.   Cardiovascular:  Negative for chest pain and palpitations.  Gastrointestinal:  Negative for abdominal pain, constipation, diarrhea, heartburn, nausea and vomiting.  Musculoskeletal:  Negative for joint pain and myalgias.  Neurological:  Negative for dizziness, tingling, tremors, sensory change, speech change, focal weakness, seizures, loss of consciousness, weakness and headaches.  Psychiatric/Behavioral:  Positive for depression and hallucinations. Negative for memory loss, substance abuse and suicidal ideas. The patient is nervous/anxious and has insomnia.    Blood pressure (!) 140/82, pulse 91, temperature 98.3 F (36.8 C), temperature source Oral, resp. rate 20, height 5\' 9"  (1.753 m), weight 110.5 kg, SpO2 100 %. Body mass index is 35.97 kg/m.  Treatment Plan Summary: Daily contact with patient to assess and evaluate symptoms and progress in treatment and Medication management.   Continue inpatient hospitalization.  Will continue today 12/14/2022 plan as below except where it  is noted.   Principal/active diagnoses.  Schizophrenia spectrum disorder with psychotic disorder type not yet determined.    Associated symptoms.  Psychosis.  Insomnia. Agitation.  Plan:  -Increase Risperdal M-tab  to 2 mg BID for mood stabilization & psychosis. -Start Zoloft 25 mg today and increase to 50 mg daily on 6/4 for depressive symptoms -Start Inderal 10 mg BID for tachycardia   -Continue Trazodone 100 mg po Q hs prn, may repeat x 1 for insomnia.  -Continue Hydroxyzine 25 mg po tid prn for anxiety.    Agitation protocols: Cont as recommended;  -Benadryl 50 mg po or IM tid prn. -Haldol 5 mg po or IM tid prn.  -Lorazepam 2 mg po or IM tid prn.   Other medical issues.  -Continue Bactrim DS 800-160 po bid for uti x 6 doses.   Other PRNS -Continue Tylenol 650 mg every 6 hours PRN for mild pain -Continue Maalox 30 ml Q 4 hrs PRN for indigestion -Continue MOM 30 ml po Q 6 hrs for constipation   Safety and Monitoring: Voluntary admission to inpatient psychiatric unit for safety, stabilization and treatment Daily contact with patient to assess and evaluate symptoms and progress in treatment Patient's case to be discussed in multi-disciplinary team meeting Observation Level : q15 minute checks Vital signs: q12 hours Precautions: Safety   Discharge Planning: Social work and case management to assist with discharge planning and identification of hospital follow-up needs prior to discharge Estimated LOS: 5-7 days Discharge Concerns: Need to establish a safety plan; Medication compliance and effectiveness Discharge Goals: Return home with outpatient referrals for mental health follow-up including medication management/psychotherapy  Starleen Blue, NP, pmhnp 12/14/2022, 6:05 PMPatient ID: Kara Nguyen, female   DOB: [birth year], 21 y.o.   MRN: [id number] Patient ID: Kara Nguyen, female   DOB: 2000/10/19, 21 y.o.   MRN:  [id number] Patient ID: Kara Nguyen, female    DOB: 04/14/2001, 21 y.o.   MRN: [id number]

## 2022-12-14 NOTE — Progress Notes (Signed)
   12/14/22 0553  15 Minute Checks  Location Bedroom  Visual Appearance Calm  Behavior Composed  Sleep (Behavioral Health Patients Only)  Calculate sleep? (Click Yes once per 24 hr at 0600 safety check) Yes  Documented sleep last 24 hours 4.75

## 2022-12-14 NOTE — Progress Notes (Signed)
   12/13/22 2045  Psych Admission Type (Psych Patients Only)  Admission Status Involuntary  Psychosocial Assessment  Patient Complaints Anxiety  Eye Contact Fair  Facial Expression Flat  Affect Appropriate to circumstance  Speech Soft  Interaction Guarded  Motor Activity Slow  Appearance/Hygiene Unremarkable  Behavior Characteristics Anxious;Impulsive  Mood Anxious;Preoccupied  Thought Process  Coherency Tangential;Blocking  Content Preoccupation  Delusions None reported or observed  Perception Hallucinations  Hallucination None reported or observed  Judgment Poor  Confusion None  Danger to Self  Current suicidal ideation? Denies  Danger to Others  Danger to Others None reported or observed

## 2022-12-14 NOTE — BHH Group Notes (Signed)
Adult Psychoeducational Group Note  Date:  12/14/2022 Time:  9:11 PM  Group Topic/Focus:  Wrap-Up Group:   The focus of this group is to help patients review their daily goal of treatment and discuss progress on daily workbooks.  Participation Level:  Active  Participation Quality:  Appropriate  Affect:  Appropriate  Cognitive:  Appropriate  Insight: Appropriate  Engagement in Group:  Developing/Improving  Modes of Intervention:  Discussion  Additional Comments:  Pt stated her goal for today was to focus on her treatment plan and discuss her discharge plan with her treatment team. Pt stated she accomplished her goals today. Pt stated she talked with her doctor and her social worker about her care today. Pt rated her overall day a 10. Pt stated she was able to contact her mother and her father coming for visitation today improved her overall day. Pt stated she felt better about herself tonight. Pt stated she was able to attend all groups held today. Pt stated staff brought back all her meals today because she is on unit restriction. Pt stated she took all medications provided today. Pt stated her appetite was pretty good today. Pt rated her sleep last night was fair. Pt stated the goal tonight was to get some rest. Pt stated she had no physical pain tonight. Pt admitted to dealing with visual hallucinations and auditory issues tonight. Pt nurse was updated on situation. Pt denies thoughts of harming herself or others. Pt stated she would alert staff if anything changed.  Felipa Furnace 12/14/2022, 9:11 PM

## 2022-12-14 NOTE — Plan of Care (Signed)
  Problem: Education: Goal: Emotional status will improve Outcome: Progressing Goal: Mental status will improve Outcome: Progressing   Problem: Health Behavior/Discharge Planning: Goal: Compliance with treatment plan for underlying cause of condition will improve Outcome: Progressing   Problem: Education: Goal: Knowledge of the prescribed therapeutic regimen will improve Outcome: Progressing

## 2022-12-14 NOTE — Progress Notes (Signed)
  Patient denies HI and VH. Patient reports decreasing auditory hallucinations and si without a plan. Patient is noted to be actively engaged in groups, compliant with medications and has had no behavioral dyscontrol.    Assess patient for safety, offer medications as prescribed, engage patient in 1:1 staff talks. Patient has required limited need for redirection this shift and has had a decrease in bizarre behaviors.     Patient able to contract for safety. Continue to monitor as planned

## 2022-12-14 NOTE — Group Note (Signed)
LCSW Group Therapy Note  Group Date: 12/14/2022 Start Time: 1300 End Time: 1400   Type of Therapy and Topic:  Group Therapy - How To Cope with Nervousness about Discharge   Participation Level:  Active   Description of Group This process group involved identification of patients' feelings about discharge. Some of them are scheduled to be discharged soon, while others are new admissions, but each of them was asked to share thoughts and feelings surrounding discharge from the hospital. One common theme was that they are excited at the prospect of going home, while another was that many of them are apprehensive about sharing why they were hospitalized. Patients were given the opportunity to discuss these feelings with their peers in preparation for discharge.  Therapeutic Goals  Patient will identify their overall feelings about pending discharge. Patient will think about how they might proactively address issues that they believe will once again arise once they get home (i.e. with parents). Patients will participate in discussion about having hope for change.   Summary of Patient Progress:  Tanessa was very active throughout the session. She demonstrated good insight into the subject matter, and proved open to input from peers and feedback from CSW. She was respectful of peers and participated throughout the entire session.   Therapeutic Modalities Cognitive Behavioral Therapy   Beatris Si, LCSW 12/14/2022  2:04 PM

## 2022-12-14 NOTE — Group Note (Signed)
Date:  12/14/2022 Time:  9:34 AM  Group Topic/Focus:  Goals Group:   The focus of this group is to help patients establish daily goals to achieve during treatment and discuss how the patient can incorporate goal setting into their daily lives to aide in recovery.    Participation Level:  Did Not Attend   Donell Beers 12/14/2022, 9:34 AM

## 2022-12-14 NOTE — Progress Notes (Signed)
Patient got up walking up the hall as if she was sleep walking. She was redirected back to her room. Patient up again a few minutes later trying to go into other patients rooms and had to be redirected back to her room.

## 2022-12-14 NOTE — Group Note (Signed)
Recreation Therapy Group Note   Group Topic:Personal Development  Group Date: 12/14/2022 Start Time: 1010 End Time: 1100 Facilitators: Akshar Starnes-McCall, LRT,CTRS Location: [address] Dayroom   Goal Area(s) Addresses: Patient will successfully practice self-awareness and reflect on current values, lifestyle, and habits.   Patient will identify how skills learned during activity can be used to reach post d/c goals and make healthy changes.    Group Description: My DBT House. LRT and patients held a group discussion on behavioral expectations and group topic promoting self-awareness and reflection. Writer drew a diagram of a house and used interactive methods to incorporate patients in the labelling process, allowing for open response and teach back to ensure understanding. Patients were given their own sheet to label as the group shared ideas.  Sections and labels included:        Foundation- Values that govern their life       Walls- People and things that support them through the day to day       Door- Things they hide from others        Basement- Behaviors they are trying to gain control of or areas of their life they want to change       1st Floor- Emotions they want to experience more often, more fully, or in a healthier way       2nd Floor- List of all the things they are happy about or want to feel happy about       3rd Floor/Attic- List of what a "life worth living" would look like for them       Roof- People or factors that protect them       Chimney- Challenging emotions and triggers they experience       Smoke- Ways they "blow off steam"      Yard Sign- Things they are proud of and want others to see       Sunshine- What brings them joy  Patients were instructed to complete this with realistic answers, not filtering responses. Patients were offered debriefing on the activity and encouraged to speak on areas they like about what they listed and what they want to see change  within their diagram post discharge.    Affect/Mood: Euphoric   Participation Level: Minimal   Participation Quality: Independent   Behavior: Paranoid   Speech/Thought Process: Delusional   Insight: Lacking   Judgement: Lacking    Modes of Intervention: DBT Techniques   Patient Response to Interventions:  Challenging    Education Outcome:  In group clarification offered    Clinical Observations/Individualized Feedback: Pt seemed to be responding to internal stimuli.  Pt had periods of not being able to sit still and was random in her movements, such as getting up and twirling around in group.  Pt worked on her sheet for a while before leaving and not returning.     Plan: Continue to engage patient in RT group sessions 2-3x/week.   Dallon Dacosta-McCall, LRT,CTRS 12/14/2022 1:37 PM

## 2022-12-15 ENCOUNTER — Telehealth (HOSPITAL_COMMUNITY): Payer: Self-pay | Admitting: Pharmacy Technician

## 2022-12-15 ENCOUNTER — Other Ambulatory Visit (HOSPITAL_COMMUNITY): Payer: Self-pay

## 2022-12-15 LAB — URINALYSIS, W/ REFLEX TO CULTURE (INFECTION SUSPECTED)
Bilirubin Urine: NEGATIVE
Glucose, UA: NEGATIVE mg/dL
Hgb urine dipstick: NEGATIVE
Ketones, ur: NEGATIVE mg/dL
Nitrite: NEGATIVE
Protein, ur: NEGATIVE mg/dL
Specific Gravity, Urine: 1.018 (ref 1.005–1.030)
pH: 6 (ref 5.0–8.0)

## 2022-12-15 MED ORDER — LORAZEPAM 0.5 MG PO TABS
0.5000 mg | ORAL_TABLET | Freq: Two times a day (BID) | ORAL | Status: DC
  Administered 2022-12-15 – 2022-12-18 (×7): 0.5 mg via ORAL
  Filled 2022-12-15 (×7): qty 1

## 2022-12-15 MED ORDER — NICOTINE POLACRILEX 2 MG MT GUM: 2.0000 mg | CHEWING_GUM | OROMUCOSAL | Status: DC | PRN

## 2022-12-15 MED ORDER — PROPRANOLOL HCL 20 MG PO TABS
20.0000 mg | ORAL_TABLET | Freq: Three times a day (TID) | ORAL | Status: DC
  Administered 2022-12-15 – 2022-12-18 (×9): 20 mg via ORAL
  Filled 2022-12-15 (×15): qty 1

## 2022-12-15 NOTE — Progress Notes (Signed)
Pt did not attend wrap-up group   

## 2022-12-15 NOTE — Progress Notes (Signed)
[facility] MD Progress Note  12/15/2022 5:42 PM Kara Nguyen  MRN:  [id number]  Reason for admission:  This the second psychiatric admission/treatments in this [facility] for this 22 year old AA female with hx of mental illness. Kara Nguyen was a patient in this [facility] last month (April, 2024) from 11-03-22 thru 11-09-22. She was stabilized & discharged on medications with an outpatient psychiatric appointments scheduled at the [facility] in [place], [place]. It is however, unsure at this time if patient was compliant with her outpatient psychiatric recommendations as she currently presents as a poor historian. She is being re-admitted to the [facility] this time around with complain of worsening psychosis & insomnia. And while at the [facility] ED, she was noted to have urinary tract infections after she became feverish. She received some IV fluids/antibiotic treatment & later discharged on oral antibiotics. After medical evaluation/stabilization/clearance, Kara Nguyen was transferred to the [facility] for further psychiatric evaluation & treatments.    24 hr chart review: Sleep Hours last night: 5.25 hours per nursing documentation Nursing Concerns: None reported or documented Behavioral episodes in the past 24 hrs: None Medication Compliance: Compliant Vital Signs in the past 24 hrs: Elevations in heart rate earlier today morning at 108, which is an improvement in heart rate as compared to yesterday.  BP also with an improvement as compared with SBP in the 140s yesterday.  PRN Medications in the past 24 hrs: None   Patient assessment note: During today's encounter, thoughts are disorganized and there is some degree of tangentiality along with flights of ideas in responses to questions, but to a lesser extent as compared to yesterday.  When asked how patient slept last night, she states "I feel like maybe I can control the world or something, and maybe change the trajectory of time." She is however more visible in the milieu and  dayroom & less isolative to her room, maintaining more eye contact today, appears less guarded, and intermittently smiles during assessment.   Today, pt denies SI/HI/AVH, states that she loves her brother and would never hurt him when asked if he is having homicidal thoughts towards her brother as she had reported yesterday.  She denies paranoia, but continues to present with delusional thinking, AEB the statement made above above regarding wanted to change the world.  She talks about being depressed because "I am trapped here like a little mouse."  Patient reports that she is tolerating all of her medications with no medications related side effects. She denies being in any physical pain today. We increased Propranolol to 20 mg Q 8 H for tachycardia and added Ativan temporarily at 0.5 mg BID for catatonia type symptoms.  Continues hospitalization remains necessary as medication adjustments are made to treat and stabilize patient's mental status.  Even though there is an improvement in symptoms, she remains psychotic and depressed.  We will continue to revisit discharge planning on a daily basis.  Repeated UA has large leukocytes and, rare bacteria. Repeated culture is pending. Pt denies symptoms. Will await results of culture.   Principal Problem: Schizophrenia spectrum disorder with psychotic disorder type not yet determined (HCC)  Diagnosis: Principal Problem:   Schizophrenia spectrum disorder with psychotic disorder type not yet determined (HCC) Active Problems:   Psychosis (HCC)  Total Time spent with patient:  35 minutes  Past Psychiatric History: See H&P  Past Medical History:  Past Medical History:  Diagnosis Date   Anxiety    Bipolar disorder (HCC)    Deliberate self-cutting  Depression    History reviewed. No pertinent surgical history. Family History:  Family History  Problem Relation Age of Onset   Cancer Other    Diabetes Paternal Grandmother    Family Psychiatric   History: See H&P.  Social History:  Social History   Substance and Sexual Activity  Alcohol Use Never   Alcohol/week: 0.0 standard drinks of alcohol     Social History   Substance and Sexual Activity  Drug Use Not Currently   Types: Solvent inhalants    Social History   Socioeconomic History   Marital status: Single    Spouse name: Not on file   Number of children: 0   Years of education: Not on file   Highest education level: 10th grade  Occupational History   Occupation: Consulting civil engineer  Tobacco Use   Smoking status: Never   Smokeless tobacco: Never  Vaping Use   Vaping Use: Never used  Substance and Sexual Activity   Alcohol use: Never    Alcohol/week: 0.0 standard drinks of alcohol   Drug use: Not Currently    Types: Solvent inhalants   Sexual activity: Never  Other Topics Concern   Not on file  Social History Narrative   She lives with mother, father, and sibling.     Social Determinants of Health   Financial Resource Strain: Not on file  Food Insecurity: Patient Declined (12/09/2022)   Hunger Vital Sign    Worried About Running Out of Food in the Last Year: Patient declined    Ran Out of Food in the Last Year: Patient declined  Transportation Needs: No Transportation Needs (12/09/2022)   PRAPARE - Administrator, Civil Service (Medical): No    Lack of Transportation (Non-Medical): No  Physical Activity: Not on file  Stress: Not on file  Social Connections: Not on file   Additional Social History:   Sleep: Good  Appetite:  Fair  Current Medications: Current Facility-Administered Medications  Medication Dose Route Frequency Provider Last Rate Last Admin   acetaminophen (TYLENOL) tablet 650 mg  650 mg Oral Q6H PRN Starkes-Perry, Juel Burrow, FNP       alum & mag hydroxide-simeth (MAALOX/MYLANTA) 200-200-20 MG/5ML suspension 30 mL  30 mL Oral Q4H PRN Starkes-Perry, Juel Burrow, FNP       diphenhydrAMINE (BENADRYL) capsule 50 mg  50 mg Oral TID PRN  Maryagnes Amos, FNP   50 mg at 12/10/22 2348   Or   diphenhydrAMINE (BENADRYL) injection 50 mg  50 mg Intramuscular TID PRN Maryagnes Amos, FNP   50 mg at 12/10/22 0201   haloperidol (HALDOL) tablet 5 mg  5 mg Oral TID PRN Maryagnes Amos, FNP   5 mg at 12/10/22 2348   Or   haloperidol lactate (HALDOL) injection 5 mg  5 mg Intramuscular TID PRN Maryagnes Amos, FNP   5 mg at 12/10/22 0202   hydrOXYzine (ATARAX) tablet 50 mg  50 mg Oral Once Armandina Stammer I, NP       hydrOXYzine (ATARAX) tablet 50 mg  50 mg Oral TID PRN Armandina Stammer I, NP   50 mg at 12/14/22 2228   LORazepam (ATIVAN) tablet 2 mg  2 mg Oral TID PRN Maryagnes Amos, FNP   2 mg at 12/13/22 1650   Or   LORazepam (ATIVAN) injection 2 mg  2 mg Intramuscular TID PRN Maryagnes Amos, FNP   2 mg at 12/10/22 0202   LORazepam (ATIVAN) tablet 0.5 mg  0.5 mg Oral Q12H Massengill, Harrold Donath, MD   0.5 mg at 12/15/22 4742   magnesium hydroxide (MILK OF MAGNESIA) suspension 30 mL  30 mL Oral Daily PRN Maryagnes Amos, FNP       propranolol (INDERAL) tablet 20 mg  20 mg Oral Q8H Massengill, Nathan, MD   20 mg at 12/15/22 1321   risperiDONE (RISPERDAL M-TABS) disintegrating tablet 2 mg  2 mg Oral QHS Massengill, Harrold Donath, MD   2 mg at 12/14/22 2052   risperiDONE (RISPERDAL M-TABS) disintegrating tablet 2 mg  2 mg Oral Daily Massengill, Harrold Donath, MD   2 mg at 12/15/22 0820   sertraline (ZOLOFT) tablet 50 mg  50 mg Oral Daily Massengill, Harrold Donath, MD   50 mg at 12/15/22 0820   traZODone (DESYREL) tablet 100 mg  100 mg Oral QHS,MR X 1 Maryagnes Amos, FNP   100 mg at 12/14/22 2229   Lab Results:  Results for orders placed or performed during the hospital encounter of 12/09/22 (from the past 48 hour(s))  Urinalysis, w/ Reflex to Culture (Infection Suspected) -Urine, Clean Catch     Status: Abnormal   Collection Time: 12/14/22  7:56 AM  Result Value Ref Range   Specimen Source URINE, CLEAN CATCH     Color, Urine YELLOW YELLOW   APPearance HAZY (A) CLEAR   Specific Gravity, Urine 1.018 1.005 - 1.030   pH 6.0 5.0 - 8.0   Glucose, UA NEGATIVE NEGATIVE mg/dL   Hgb urine dipstick NEGATIVE NEGATIVE   Bilirubin Urine NEGATIVE NEGATIVE   Ketones, ur NEGATIVE NEGATIVE mg/dL   Protein, ur NEGATIVE NEGATIVE mg/dL   Nitrite NEGATIVE NEGATIVE   Leukocytes,Ua LARGE (A) NEGATIVE   RBC / HPF 0-5 0 - 5 RBC/hpf   WBC, UA 21-50 0 - 5 WBC/hpf    Comment:        Reflex urine culture not performed if WBC <=10, OR if Squamous epithelial cells >5. If Squamous epithelial cells >5 suggest recollection.    Bacteria, UA RARE (A) NONE SEEN   Squamous Epithelial / HPF 0-5 0 - 5 /HPF   Ca Oxalate Crys, UA PRESENT     Comment: Performed at [facility], [address] [place], [place] [address]    Blood Alcohol level:  Lab Results  Component Value Date   ETH <10 12/06/2022   ETH <10 11/01/2022    Metabolic Disorder Labs: Lab Results  Component Value Date   HGBA1C 4.8 11/06/2022   MPG 91.06 11/06/2022   MPG 93.93 01/16/2018   Lab Results  Component Value Date   PROLACTIN 29.5 (H) 01/10/2018   Lab Results  Component Value Date   CHOL 118 11/05/2022   TRIG 59 11/05/2022   HDL 49 11/05/2022   CHOLHDL 2.4 11/05/2022   VLDL 12 11/05/2022   LDLCALC 57 11/05/2022   LDLCALC 55 01/16/2018    Physical Findings: AIMS: Facial and Oral Movements Muscles of Facial Expression: None, normal Lips and Perioral Area: None, normal Jaw: None, normal Tongue: None, normal,Extremity Movements Upper (arms, wrists, hands, fingers): None, normal Lower (legs, knees, ankles, toes): None, normal, Trunk Movements Neck, shoulders, hips: None, normal, Overall Severity Severity of abnormal movements (highest score from questions above): None, normal Incapacitation due to abnormal movements: None, normal Patient's awareness of abnormal movements (rate only patient's report): No Awareness,  Dental Status Current problems with teeth and/or dentures?: No Does patient usually wear dentures?: No  CIWA:    COWS:     Musculoskeletal:  Strength & Muscle Tone: within normal limits Gait & Station: normal Patient leans: N/A  Psychiatric Specialty Exam:  Presentation  General Appearance:  Appropriate for Environment; Fairly Groomed  Eye Contact: Fair  Speech: Clear and Coherent  Speech Volume: Normal  Handedness: Right   Mood and Affect  Mood: Depressed; Anxious  Affect: Congruent   Thought Process  Thought Processes: Disorganized  Descriptions of Associations:Tangential  Orientation:Full (Time, Place and Person)  Thought Content:Illogical  History of Schizophrenia/Schizoaffective disorder:Yes  Duration of Psychotic Symptoms:Greater than six months  Hallucinations:Hallucinations: None   Ideas of Reference:Delusions  Suicidal Thoughts:Suicidal Thoughts: No   Homicidal Thoughts:Homicidal Thoughts: No    Sensorium  Memory: Immediate Good  Judgment: Poor  Insight: Poor   Executive Functions  Concentration: Fair  Attention Span: Fair  Recall: Fair  Fund of Knowledge: Fair  Language: Fair   Psychomotor Activity  Psychomotor Activity: Psychomotor Activity: Normal   Assets  Assets: Social Support; Resilience  Sleep  Sleep: Sleep: Good   Physical Exam: Physical Exam Vitals and nursing note reviewed.  HENT:     Mouth/Throat:     Pharynx: Oropharynx is clear.  Pulmonary:     Effort: Pulmonary effort is normal.  Genitourinary:    Comments: Deferred Musculoskeletal:        General: Normal range of motion.     Cervical back: Normal range of motion.  Skin:    General: Skin is warm.  Neurological:     General: No focal deficit present.     Mental Status: She is alert and oriented to person, place, and time.    Review of Systems  Constitutional:  Negative for chills and fever.  HENT:  Negative for  congestion and sore throat.   Respiratory:  Negative for cough, shortness of breath and wheezing.   Cardiovascular:  Negative for chest pain and palpitations.  Gastrointestinal:  Negative for abdominal pain, constipation, diarrhea, heartburn, nausea and vomiting.  Musculoskeletal:  Negative for joint pain and myalgias.  Neurological:  Negative for dizziness, tingling, tremors, sensory change, speech change, focal weakness, seizures, loss of consciousness, weakness and headaches.  Psychiatric/Behavioral:  Positive for depression and hallucinations. Negative for memory loss, substance abuse and suicidal ideas. The patient is nervous/anxious and has insomnia.    Blood pressure 109/69, pulse 75, temperature 98.4 F (36.9 C), temperature source Oral, resp. rate 17, height 5\' 9"  (1.753 m), weight 110.5 kg, SpO2 100 %. Body mass index is 35.97 kg/m.  Treatment Plan Summary: Daily contact with patient to assess and evaluate symptoms and progress in treatment and Medication management.   Continue inpatient hospitalization.  Will continue today 12/15/2022 plan as below except where it is noted.   Principal/active diagnoses.  Schizophrenia spectrum disorder with psychotic disorder type not yet determined.    Associated symptoms.  Psychosis.  Insomnia. Agitation.  Plan:  -Continue Risperdal M-tab  to 2 mg BID for mood stabilization & psychosis. -Continue Zoloft 50 mg daily for depressive symptoms -Increase Inderal to 20 mg Q 8 H for tachycardia  -Start Ativan 0.5 mg BID for catatonia  -Continue Trazodone 100 mg po Q hs prn, may repeat x 1 for insomnia.  -Continue Hydroxyzine 25 mg po tid prn for anxiety.    Agitation protocols: Cont as recommended;  -Benadryl 50 mg po or IM tid prn. -Haldol 5 mg po or IM tid prn.  -Lorazepam 2 mg po or IM tid prn.   Other medical issues.  -Continue Bactrim DS 800-160 po bid for uti  x 6 doses.   Other PRNS -Continue Tylenol 650 mg every 6 hours PRN for  mild pain -Continue Maalox 30 ml Q 4 hrs PRN for indigestion -Continue MOM 30 ml po Q 6 hrs for constipation   Safety and Monitoring: Voluntary admission to inpatient psychiatric unit for safety, stabilization and treatment Daily contact with patient to assess and evaluate symptoms and progress in treatment Patient's case to be discussed in multi-disciplinary team meeting Observation Level : q15 minute checks Vital signs: q12 hours Precautions: Safety   Discharge Planning: Social work and case management to assist with discharge planning and identification of hospital follow-up needs prior to discharge Estimated LOS: 5-7 days Discharge Concerns: Need to establish a safety plan; Medication compliance and effectiveness Discharge Goals: Return home with outpatient referrals for mental health follow-up including medication management/psychotherapy  Starleen Blue, NP, pmhnp 12/15/2022, 5:42 PMPatient ID: Kara Nguyen, female   DOB: [birth year], 21 y.o.

## 2022-12-15 NOTE — Progress Notes (Signed)
   12/15/22 1950  Psych Admission Type (Psych Patients Only)  Admission Status Involuntary  Psychosocial Assessment  Patient Complaints Anxiety  Eye Contact Fair  Facial Expression Flat  Affect Anxious  Speech Logical/coherent  Interaction Assertive  Motor Activity Slow  Appearance/Hygiene Unremarkable  Behavior Characteristics Cooperative  Mood Anxious  Thought Process  Coherency WDL  Content WDL  Delusions None reported or observed  Perception Hallucinations  Hallucination Auditory  Judgment Limited  Confusion None  Danger to Self  Current suicidal ideation? Denies  Self-Injurious Behavior No self-injurious ideation or behavior indicators observed or expressed   Agreement Not to Harm Self Yes  Description of Agreement Verbal  Danger to Others  Danger to Others None reported or observed

## 2022-12-15 NOTE — Progress Notes (Signed)
   12/15/22 1000  Psych Admission Type (Psych Patients Only)  Admission Status Involuntary  Psychosocial Assessment  Patient Complaints Anxiety  Eye Contact Fair  Facial Expression Animated  Affect Anxious  Speech Logical/coherent  Interaction Assertive  Motor Activity Slow  Appearance/Hygiene Unremarkable  Behavior Characteristics Cooperative  Mood Preoccupied  Thought Process  Coherency WDL  Content WDL  Delusions None reported or observed  Perception Hallucinations  Hallucination Auditory  Judgment Limited  Confusion None  Danger to Self  Current suicidal ideation? Denies  Danger to Others  Danger to Others None reported or observed   Dar Note: Patient presents with calm affect and mood.  Denies suicidal thoughts, auditory and visual hallucinations.  Medications given as prescribed.  Routine safety checks maintained.  Patient visible in milieu interacting well peers and staff.  Attended group and participated.  Patient is safe on and off the unit.

## 2022-12-15 NOTE — Group Note (Signed)
Recreation Therapy Group Note   Group Topic:Health and Wellness  Group Date: 12/15/2022 Start Time: 1045 End Time: 1115 Facilitators: Celestial Barnfield-McCall, LRT,CTRS Location: [address] Dayroom   Goal Area(s) Addresses:  Patient will identify benefits of exercise. Patient will identify a place in the community to go and exercise.  Patient will identify what they like to do for exercise.   Group Description: Group was started with sttretching and discussing exercise, the benefits of exercise and where to exercise. Next the patient led the group in the exercises of their choosing.  Patients were to complete at least 30 minutes of exercise.  Patients were to get water or take breaks as needed. Patient(s) and LRT debriefed on what feelings and emotions they were feeling at completion of the activity.    Affect/Mood: Appropriate   Participation Level: Engaged   Participation Quality: Independent   Behavior: Appropriate   Speech/Thought Process: Focused   Insight: Good   Judgement: Good   Modes of Intervention: Wellness   Patient Response to Interventions:  Engaged   Education Outcome:  Acknowledges education   Clinical Observations/Individualized Feedback: Pt was appropriate and able to focus during group session.  Pt was able to lead group in some exercises and engage in the exercises of her peers.  Pt didn't seem to be responding.  Pt was on task throughout group session.     Plan: Continue to engage patient in RT group sessions 2-3x/week.   Kara Nguyen, LRT,CTRS 12/15/2022 1:20 PM

## 2022-12-15 NOTE — Plan of Care (Signed)
   Problem: Education: Goal: Emotional status will improve Outcome: Progressing Goal: Mental status will improve Outcome: Progressing   Problem: Coping: Goal: Ability to demonstrate self-control will improve Outcome: Progressing   

## 2022-12-15 NOTE — Telephone Encounter (Signed)
Patient Advocate Encounter  Prior Authorization for [name] 100MG /0.28ML syringes has been approved.    PA# [id number] Insurance [employer] [place] Form Effective dates: 12/15/2022 through 12/15/2023  Patients co-pay is $0.00.     Kara Nguyen, CPhT Pharmacy Patient Advocate Specialist [facility] Patient Advocate Team Direct Number: [phone]  Fax: [phone]

## 2022-12-15 NOTE — Progress Notes (Signed)
   12/15/22 1610  15 Minute Checks  Location Bedroom  Visual Appearance Calm  Behavior Composed  Sleep (Behavioral Health Patients Only)  Calculate sleep? (Click Yes once per 24 hr at 0600 safety check) Yes  Documented sleep last 24 hours 5.25

## 2022-12-16 ENCOUNTER — Encounter (HOSPITAL_COMMUNITY): Payer: Self-pay

## 2022-12-16 LAB — URINE CULTURE: Culture: 20000 — AB

## 2022-12-16 MED ORDER — RISPERIDONE ER 100 MG/0.28ML ~~LOC~~ SUSY
100.0000 mg | PREFILLED_SYRINGE | SUBCUTANEOUS | Status: DC
  Administered 2022-12-16: 100 mg via SUBCUTANEOUS

## 2022-12-16 NOTE — Plan of Care (Signed)
  Problem: Education: Goal: Emotional status will improve Outcome: Progressing Goal: Mental status will improve Outcome: Progressing   Problem: Activity: Goal: Sleeping patterns will improve Outcome: Progressing   

## 2022-12-16 NOTE — Progress Notes (Signed)
   12/16/22 0547  15 Minute Checks  Location Bedroom  Visual Appearance Calm  Behavior Sleeping  Sleep (Behavioral Health Patients Only)  Calculate sleep? (Click Yes once per 24 hr at 0600 safety check) Yes  Documented sleep last 24 hours 8.25

## 2022-12-16 NOTE — Progress Notes (Signed)
   12/16/22 1117  Psych Admission Type (Psych Patients Only)  Admission Status Involuntary  Psychosocial Assessment  Patient Complaints Anxiety  Eye Contact Fair  Facial Expression Other (Comment) (Congruent)  Affect Appropriate to circumstance  Speech Logical/coherent;Soft  Interaction Assertive  Motor Activity Slow  Appearance/Hygiene Unremarkable  Behavior Characteristics Cooperative  Mood Anxious  Aggressive Behavior  Effect No apparent injury  Thought Process  Coherency Circumstantial  Content WDL  Delusions None reported or observed  Perception Hallucinations  Hallucination Auditory (Denies at this time)  Judgment Limited  Confusion None  Danger to Self  Current suicidal ideation? Denies  Self-Injurious Behavior No self-injurious ideation or behavior indicators observed or expressed   Agreement Not to Harm Self Yes  Description of Agreement Verbal contract  Danger to Others  Danger to Others None reported or observed

## 2022-12-16 NOTE — B[facility] Group Notes (Signed)
Adult Psychoeducational Group Note  Date:  12/16/2022 Time:  8:43 PM  Group Topic/Focus:  Wrap-Up Group:   The focus of this group is to help patients review their daily goal of treatment and discuss progress on daily workbooks.  Participation Level:  Active  Participation Quality:  Appropriate  Affect:  Appropriate  Cognitive:  Appropriate  Insight: Appropriate  Engagement in Group:  Engaged  Modes of Intervention:  Discussion and Support  Additional Comments:  Pt attended the evening group and responded to all discussion prompts from the Writer. Pt shared that today was a good day on the unit, the highlight of which was a positive visit from her Dad, whom she considers supportive. On the subject of staying well upon discharge, Pt mentioned "watching out for the wrong people" and "not trusting just anybody." Pt rated her day a 10 out of 10.  Christ Kick 12/16/2022, 8:43 PM

## 2022-12-16 NOTE — Progress Notes (Signed)
[facility] MD Progress Note  12/16/2022 3:39 PM Kara Nguyen  MRN:  [id number]  Reason for admission:  This the second psychiatric admission/treatments in this [facility] for this 22 year old AA female with hx of mental illness. Kara Nguyen was a patient in this [facility] last month (April, 2024) from 11-03-22 thru 11-09-22. She was stabilized & discharged on medications with an outpatient psychiatric appointments scheduled at the [facility] in [place], [place]. It is however, unsure at this time if patient was compliant with her outpatient psychiatric recommendations as she currently presents as a poor historian. She is being re-admitted to the [facility] this time around with complain of worsening psychosis & insomnia. And while at the [facility] ED, she was noted to have urinary tract infections after she became feverish. She received some IV fluids/antibiotic treatment & later discharged on oral antibiotics. After medical evaluation/stabilization/clearance, Kara Nguyen was transferred to the [facility] for further psychiatric evaluation & treatments.    24 hr chart review: Sleep Hours last night: 8.25 hours per nursing documentation Nursing Concerns: None Behavioral episodes in the past 24 hrs: None Medication Compliance: Compliant Vital Signs in the past 24 hrs: WNL PRN Medications in the past 24 hrs: None   Patient assessment note: During encounter, Pt presents with a less depressed mood, her attention to personal hygiene and grooming has significantly improved, eye contact is good, speech is clearer & more coherent. Thought contents are more organized and more logical than at admission at the days following admission. Pt denies SI/HI/AVH. She denies paranoia today, states that her father does not believe in medications and in mental illness, and does not support her taking her medications. Pt educated on the need to take, her medications consistently after discharge, and states she will continue taking her medications, and has also  been able to verbalize that taking her meds will keep her from rehospitalization.   Patient reports a good sleep quality last night, reports a good appetite, reports some back pain, but states that Tylenol is helpful. We have given pt Risperdal Uzedi LAI 100 mg toaday at 1319. We plan to monitor for the next 24 hrs, and discharge pt pending safety planning being completed and outpatient appointments being completed.   Principal Problem: Schizophrenia spectrum disorder with psychotic disorder type not yet determined (HCC)  Diagnosis: Principal Problem:   Schizophrenia spectrum disorder with psychotic disorder type not yet determined (HCC) Active Problems:   Psychosis (HCC)  Total Time spent with patient:  35 minutes  Past Psychiatric History: See H&P  Past Medical History:  Past Medical History:  Diagnosis Date   Anxiety    Bipolar disorder (HCC)    Deliberate self-cutting    Depression    History reviewed. No pertinent surgical history. Family History:  Family History  Problem Relation Age of Onset   Cancer Other    Diabetes Paternal Grandmother    Family Psychiatric  History: See H&P.  Social History:  Social History   Substance and Sexual Activity  Alcohol Use Never   Alcohol/week: 0.0 standard drinks of alcohol     Social History   Substance and Sexual Activity  Drug Use Not Currently   Types: Solvent inhalants    Social History   Socioeconomic History   Marital status: Single    Spouse name: Not on file   Number of children: 0   Years of education: Not on file   Highest education level: 10th grade  Occupational History   Occupation: Consulting civil engineer  Tobacco Use  Smoking status: Never   Smokeless tobacco: Never  Vaping Use   Vaping Use: Never used  Substance and Sexual Activity   Alcohol use: Never    Alcohol/week: 0.0 standard drinks of alcohol   Drug use: Not Currently    Types: Solvent inhalants   Sexual activity: Never  Other Topics Concern   Not on  file  Social History Narrative   She lives with mother, father, and sibling.     Social Determinants of Health   Financial Resource Strain: Not on file  Food Insecurity: Patient Declined (12/09/2022)   Hunger Vital Sign    Worried About Running Out of Food in the Last Year: Patient declined    Ran Out of Food in the Last Year: Patient declined  Transportation Needs: No Transportation Needs (12/09/2022)   PRAPARE - Administrator, Civil Service (Medical): No    Lack of Transportation (Non-Medical): No  Physical Activity: Not on file  Stress: Not on file  Social Connections: Not on file   Additional Social History:   Sleep: Good  Appetite:  Fair  Current Medications: Current Facility-Administered Medications  Medication Dose Route Frequency Provider Last Rate Last Admin   acetaminophen (TYLENOL) tablet 650 mg  650 mg Oral Q6H PRN Starkes-Perry, Juel Burrow, FNP       alum & mag hydroxide-simeth (MAALOX/MYLANTA) 200-200-20 MG/5ML suspension 30 mL  30 mL Oral Q4H PRN Starkes-Perry, Juel Burrow, FNP       diphenhydrAMINE (BENADRYL) capsule 50 mg  50 mg Oral TID PRN Maryagnes Amos, FNP   50 mg at 12/10/22 2348   Or   diphenhydrAMINE (BENADRYL) injection 50 mg  50 mg Intramuscular TID PRN Maryagnes Amos, FNP   50 mg at 12/10/22 0201   haloperidol (HALDOL) tablet 5 mg  5 mg Oral TID PRN Maryagnes Amos, FNP   5 mg at 12/10/22 2348   Or   haloperidol lactate (HALDOL) injection 5 mg  5 mg Intramuscular TID PRN Maryagnes Amos, FNP   5 mg at 12/10/22 0202   hydrOXYzine (ATARAX) tablet 50 mg  50 mg Oral Once Armandina Stammer I, NP       hydrOXYzine (ATARAX) tablet 50 mg  50 mg Oral TID PRN Armandina Stammer I, NP   50 mg at 12/14/22 2228   LORazepam (ATIVAN) tablet 2 mg  2 mg Oral TID PRN Maryagnes Amos, FNP   2 mg at 12/13/22 1650   Or   LORazepam (ATIVAN) injection 2 mg  2 mg Intramuscular TID PRN Maryagnes Amos, FNP   2 mg at 12/10/22 0202    LORazepam (ATIVAN) tablet 0.5 mg  0.5 mg Oral Q12H Massengill, Nathan, MD   0.5 mg at 12/16/22 0753   magnesium hydroxide (MILK OF MAGNESIA) suspension 30 mL  30 mL Oral Daily PRN Maryagnes Amos, FNP       propranolol (INDERAL) tablet 20 mg  20 mg Oral Q8H Massengill, Nathan, MD   20 mg at 12/16/22 1311   risperiDONE (RISPERDAL M-TABS) disintegrating tablet 2 mg  2 mg Oral QHS Massengill, Nathan, MD   2 mg at 12/15/22 2039   risperiDONE (RISPERDAL M-TABS) disintegrating tablet 2 mg  2 mg Oral Daily Massengill, Nathan, MD   2 mg at 12/16/22 0753   risperiDONE ER SUSY 100 mg  100 mg Subcutaneous Q28 days Massengill, Harrold Donath, MD   100 mg at 12/16/22 1319   sertraline (ZOLOFT) tablet 50 mg  50 mg  Oral Daily Massengill, Harrold Donath, MD   50 mg at 12/16/22 0753   traZODone (DESYREL) tablet 100 mg  100 mg Oral QHS,MR X 1 Maryagnes Amos, FNP   100 mg at 12/15/22 2039   Lab Results:  No results found for this or any previous visit (from the past 48 hour(s)).   Blood Alcohol level:  Lab Results  Component Value Date   ETH <10 12/06/2022   ETH <10 11/01/2022    Metabolic Disorder Labs: Lab Results  Component Value Date   HGBA1C 4.8 11/06/2022   MPG 91.06 11/06/2022   MPG 93.93 01/16/2018   Lab Results  Component Value Date   PROLACTIN 29.5 (H) 01/10/2018   Lab Results  Component Value Date   CHOL 118 11/05/2022   TRIG 59 11/05/2022   HDL 49 11/05/2022   CHOLHDL 2.4 11/05/2022   VLDL 12 11/05/2022   LDLCALC 57 11/05/2022   LDLCALC 55 01/16/2018    Physical Findings: AIMS: Facial and Oral Movements Muscles of Facial Expression: None, normal Lips and Perioral Area: None, normal Jaw: None, normal Tongue: None, normal,Extremity Movements Upper (arms, wrists, hands, fingers): None, normal Lower (legs, knees, ankles, toes): None, normal, Trunk Movements Neck, shoulders, hips: None, normal, Overall Severity Severity of abnormal movements (highest score from questions above):  None, normal Incapacitation due to abnormal movements: None, normal Patient's awareness of abnormal movements (rate only patient's report): No Awareness, Dental Status Current problems with teeth and/or dentures?: No Does patient usually wear dentures?: No  CIWA:    COWS:     Musculoskeletal: Strength & Muscle Tone: within normal limits Gait & Station: normal Patient leans: N/A  Psychiatric Specialty Exam:  Presentation  General Appearance:  Appropriate for Environment; Fairly Groomed  Eye Contact: Good  Speech: Clear and Coherent  Speech Volume: Normal  Handedness: Right   Mood and Affect  Mood: Depressed  Affect: Congruent   Thought Process  Thought Processes: Coherent  Descriptions of Associations:Intact  Orientation:Full (Time, Place and Person)  Thought Content:Logical  History of Schizophrenia/Schizoaffective disorder:Yes  Duration of Psychotic Symptoms:Greater than six months  Hallucinations:Hallucinations: None   Ideas of Reference:None  Suicidal Thoughts:Suicidal Thoughts: No   Homicidal Thoughts:Homicidal Thoughts: No    Sensorium  Memory: Immediate Good  Judgment: Fair  Insight: Fair   Chartered certified accountant: Fair  Attention Span: Fair  Recall: [employer] of Knowledge: Fair  Language: Fair   Psychomotor Activity  Psychomotor Activity: Psychomotor Activity: Normal   Assets  Assets: Manufacturing systems engineer; Housing; Social Support  Sleep  Sleep: Sleep: Good   Physical Exam: Physical Exam Vitals and nursing note reviewed.  HENT:     Mouth/Throat:     Pharynx: Oropharynx is clear.  Pulmonary:     Effort: Pulmonary effort is normal.  Genitourinary:    Comments: Deferred Musculoskeletal:        General: Normal range of motion.     Cervical back: Normal range of motion.  Skin:    General: Skin is warm.  Neurological:     General: No focal deficit present.     Mental Status: She is  alert and oriented to person, place, and time.    Review of Systems  Constitutional:  Negative for chills and fever.  HENT:  Negative for congestion and sore throat.   Respiratory:  Negative for cough, shortness of breath and wheezing.   Cardiovascular:  Negative for chest pain and palpitations.  Gastrointestinal:  Negative for abdominal pain, constipation, diarrhea, heartburn,  nausea and vomiting.  Musculoskeletal:  Negative for joint pain and myalgias.  Neurological:  Negative for dizziness, tingling, tremors, sensory change, speech change, focal weakness, seizures, loss of consciousness, weakness and headaches.  Psychiatric/Behavioral:  Positive for depression. Negative for hallucinations, memory loss, substance abuse and suicidal ideas. The patient is nervous/anxious and has insomnia.    Blood pressure 108/61, pulse 97, temperature 97.7 F (36.5 C), temperature source Oral, resp. rate 16, height 5\' 9"  (1.753 m), weight 110.5 kg, SpO2 100 %. Body mass index is 35.97 kg/m.  Treatment Plan Summary: Daily contact with patient to assess and evaluate symptoms and progress in treatment and Medication management.   Continue inpatient hospitalization.  Will continue today 12/16/2022 plan as below except where it is noted.   Principal/active diagnoses.  Schizophrenia spectrum disorder with psychotic disorder type not yet determined.    Associated symptoms.  Psychosis.  Insomnia. Agitation.  Plan:  -Risperdal Uzedi 100 mg LAI given today, 6/5 @@ 1319 for mood stabilization/psychosis  -Continue Risperdal M-tab  to 2 mg daily for mood stabilization & psychosis. -Continue Zoloft 50 mg daily for depressive symptoms -Continue Inderal to 20 mg Q 8 H for tachycardia  -Continue Ativan 0.5 mg BID for catatonia  -Continue Trazodone 100 mg po Q hs prn, may repeat x 1 for insomnia.  -Continue Hydroxyzine 25 mg po tid prn for anxiety.    Agitation protocols: Cont as recommended;  -Benadryl 50 mg  po or IM tid prn. -Haldol 5 mg po or IM tid prn.  -Lorazepam 2 mg po or IM tid prn.   Other medical issues.  -Continue Bactrim DS 800-160 po bid for uti x 6 doses.   Other PRNS -Continue Tylenol 650 mg every 6 hours PRN for mild pain -Continue Maalox 30 ml Q 4 hrs PRN for indigestion -Continue MOM 30 ml po Q 6 hrs for constipation   Safety and Monitoring: Voluntary admission to inpatient psychiatric unit for safety, stabilization and treatment Daily contact with patient to assess and evaluate symptoms and progress in treatment Patient's case to be discussed in multi-disciplinary team meeting Observation Level : q15 minute checks Vital signs: q12 hours Precautions: Safety   Discharge Planning: Social work and case management to assist with discharge planning and identification of hospital follow-up needs prior to discharge Estimated LOS: 5-7 days Discharge Concerns: Need to establish a safety plan; Medication compliance and effectiveness Discharge Goals: Return home with outpatient referrals for mental health follow-up including medication management/psychotherapy  Starleen Blue, NP, pmhnp 12/16/2022, 3:39 PMPatient ID: Kara Nguyen, female   DOB: 09/18/2000, 21 y.o.  Patient ID: Kara Nguyen, female   DOB: Jun 01, 2001, 21 y.o.   MRN: [id number]

## 2022-12-16 NOTE — Progress Notes (Signed)
   12/16/22 1945  Psych Admission Type (Psych Patients Only)  Admission Status Involuntary  Psychosocial Assessment  Patient Complaints Anxiety  Eye Contact Fair  Facial Expression Flat  Affect Anxious  Speech Logical/coherent  Interaction Assertive  Motor Activity Slow  Appearance/Hygiene Unremarkable  Behavior Characteristics Cooperative  Mood Anxious  Thought Process  Coherency WDL  Content WDL  Delusions None reported or observed  Perception Hallucinations  Hallucination Auditory ("They're not bad tonight.")  Judgment Limited  Confusion None  Danger to Self  Current suicidal ideation? Denies  Self-Injurious Behavior No self-injurious ideation or behavior indicators observed or expressed   Agreement Not to Harm Self Yes  Description of Agreement Verbal  Danger to Others  Danger to Others None reported or observed

## 2022-12-16 NOTE — [facility] Suicide Risk Assessment (Signed)
[facility] INPATIENT:  Family/Significant Other Suicide Prevention Education  Suicide Prevention Education:  Education Completed; Dorena Bodo,  [phone]    (name of family member/significant other) has been identified by the patient as the family member/significant other with whom the patient will be residing, and identified as the person(s) who will aid the patient in the event of a mental health crisis (suicidal ideations/suicide attempt).  With written consent from the patient, the family member/significant other has been provided the following suicide prevention education, prior to the and/or following the discharge of the patient.  CSW spoke to patient father who reports that he believes she is on too much medication and should not be on any more than 1.5 mg of risperdone.  CSW agreed to let the doctor know.  Father believes she just needs additional counseling.  No guns/weapons and no safety concerns.   The suicide prevention education provided includes the following: Suicide risk factors Suicide prevention and interventions National Suicide Hotline telephone number [facility] assessment telephone number [facility] Emergency Assistance 911 [facility] and/or Residential Mobile Crisis Unit telephone number  Request made of family/significant other to: Remove weapons (e.g., guns, rifles, knives), all items previously/currently identified as safety concern.   Remove drugs/medications (over-the-counter, prescriptions, illicit drugs), all items previously/currently identified as a safety concern.  The family member/significant other verbalizes understanding of the suicide prevention education information provided.  The family member/significant other agrees to remove the items of safety concern listed above.  Kara Nguyen 12/16/2022, 1:25 PM

## 2022-12-16 NOTE — Progress Notes (Signed)
Did not attend group 

## 2022-12-16 NOTE — Group Note (Signed)
Recreation Therapy Group Note   Group Topic:Self-Esteem  Group Date: 12/16/2022 Start Time: 1020 End Time: 1050 Facilitators: Pavlos Yon-McCall, LRT,CTRS Location: [address] Dayroom   Goal Area(s) Addresses:  Patient will successfully identify positive attributes about themselves.  Patient will identify healthy ways to increase self-esteem. Patient will acknowledge benefit(s) of improved self-esteem.   Group Description:  Totika.  LRT and patients discussed the importance of self-esteem and why it's important.  LRT and patients then played a few rounds of Totika.  [place] Masse is played like [place] except the blocks are different colors and patients answered questions that corresponded with each color.  LRT asked patients questions dealing with self-esteem during activity.   Affect/Mood: Appropriate   Participation Level: Engaged   Participation Quality: Independent   Behavior: Appropriate   Speech/Thought Process: Focused   Insight: Good   Judgement: Good   Modes of Intervention: Exploration   Patient Response to Interventions:  Engaged   Education Outcome:  Acknowledges education   Clinical Observations/Individualized Feedback: Pt was calm and able to focus.  Pt wasn't responding to stimuli.  Pt was able to fully engage in the activity.  Pt shared her definition of self-respect was "how you treat others", pt live her birthday over again because "you get to eat cake", be alone is a difficult way to tests ones courage, pt would refuse to change her attitude because "that's what makes me me", pt identified a strength as "patience", pt admitted not being able to tell when someone is telling the truth, pt wants control of all aspects of her life, would want to set a world record for being quiet and talked about not being much of a talker and does more observing.    Plan: Continue to engage patient in RT group sessions 2-3x/week.   Kara Nguyen, LRT,CTRS 12/16/2022  1:41 PM

## 2022-12-16 NOTE — Progress Notes (Signed)
Adult Psychoeducational Group Note  Date:  12/16/2022 Time:  9:28 AM  Group Topic/Focus:  Goals Group:   The focus of this group is to help patients establish daily goals to achieve during treatment and discuss how the patient can incorporate goal setting into their daily lives to aide in recovery.  Participation Level:  Active  Participation Quality:  Appropriate  Affect:  Appropriate  Cognitive:  Appropriate  Insight: Appropriate  Engagement in Group:  Engaged  Modes of Intervention:  Discussion  Additional Comments: The patient engaged in goal group.  Octavio Manns 12/16/2022, 9:28 AM

## 2022-12-16 NOTE — BH IP Treatment Plan (Signed)
Interdisciplinary Treatment and Diagnostic Plan Update  12/16/2022 Time of Session:  9:45 AM Kara Nguyen MRN: [id number]  Principal Diagnosis: Schizophrenia spectrum disorder with psychotic disorder type not yet determined (HCC)  Secondary Diagnoses: Principal Problem:   Schizophrenia spectrum disorder with psychotic disorder type not yet determined (HCC) Active Problems:   Psychosis (HCC)   Current Medications:  Current Facility-Administered Medications  Medication Dose Route Frequency Provider Last Rate Last Admin   acetaminophen (TYLENOL) tablet 650 mg  650 mg Oral Q6H PRN Starkes-Perry, Juel Burrow, FNP       alum & mag hydroxide-simeth (MAALOX/MYLANTA) 200-200-20 MG/5ML suspension 30 mL  30 mL Oral Q4H PRN Starkes-Perry, Juel Burrow, FNP       diphenhydrAMINE (BENADRYL) capsule 50 mg  50 mg Oral TID PRN Maryagnes Amos, FNP   50 mg at 12/10/22 2348   Or   diphenhydrAMINE (BENADRYL) injection 50 mg  50 mg Intramuscular TID PRN Maryagnes Amos, FNP   50 mg at 12/10/22 0201   haloperidol (HALDOL) tablet 5 mg  5 mg Oral TID PRN Maryagnes Amos, FNP   5 mg at 12/10/22 2348   Or   haloperidol lactate (HALDOL) injection 5 mg  5 mg Intramuscular TID PRN Maryagnes Amos, FNP   5 mg at 12/10/22 0202   hydrOXYzine (ATARAX) tablet 50 mg  50 mg Oral Once Armandina Stammer I, NP       hydrOXYzine (ATARAX) tablet 50 mg  50 mg Oral TID PRN Armandina Stammer I, NP   50 mg at 12/14/22 2228   LORazepam (ATIVAN) tablet 2 mg  2 mg Oral TID PRN Maryagnes Amos, FNP   2 mg at 12/13/22 1650   Or   LORazepam (ATIVAN) injection 2 mg  2 mg Intramuscular TID PRN Maryagnes Amos, FNP   2 mg at 12/10/22 0202   LORazepam (ATIVAN) tablet 0.5 mg  0.5 mg Oral Q12H Massengill, Nathan, MD   0.5 mg at 12/16/22 0753   magnesium hydroxide (MILK OF MAGNESIA) suspension 30 mL  30 mL Oral Daily PRN Maryagnes Amos, FNP       propranolol (INDERAL) tablet 20 mg  20 mg Oral Q8H  Massengill, Nathan, MD   20 mg at 12/16/22 1311   risperiDONE (RISPERDAL M-TABS) disintegrating tablet 2 mg  2 mg Oral QHS Massengill, Nathan, MD   2 mg at 12/15/22 2039   risperiDONE (RISPERDAL M-TABS) disintegrating tablet 2 mg  2 mg Oral Daily Massengill, Nathan, MD   2 mg at 12/16/22 0753   risperiDONE ER SUSY 100 mg  100 mg Subcutaneous Q28 days Massengill, Harrold Donath, MD   100 mg at 12/16/22 1319   sertraline (ZOLOFT) tablet 50 mg  50 mg Oral Daily Massengill, Harrold Donath, MD   50 mg at 12/16/22 0753   traZODone (DESYREL) tablet 100 mg  100 mg Oral QHS,MR X 1 Maryagnes Amos, FNP   100 mg at 12/15/22 2039   PTA Medications: Medications Prior to Admission  Medication Sig Dispense Refill Last Dose   hydrOXYzine (ATARAX) 25 MG tablet Take 1 tablet (25 mg total) by mouth 3 (three) times daily as needed for anxiety. 30 tablet 0    risperiDONE (RISPERDAL) 0.5 MG tablet Take 3 tablets (1.5 mg total) by mouth at bedtime. 90 tablet 0    [EXPIRED] sulfamethoxazole-trimethoprim (BACTRIM DS) 800-160 MG tablet Take 1 tablet by mouth every 12 (twelve) hours for 2 days. 4 tablet 0    traZODone (  DESYREL) 100 MG tablet Take 1 tablet (100 mg total) by mouth at bedtime as needed for sleep. 30 tablet 0     Patient Stressors: Loss of relationship   Medication change or noncompliance    Patient Strengths: Religious Affiliation  Supportive family/friends   Treatment Modalities: Medication Management, Group therapy, Case management,  1 to 1 session with clinician, Psychoeducation, Recreational therapy.   Physician Treatment Plan for Primary Diagnosis: Schizophrenia spectrum disorder with psychotic disorder type not yet determined (HCC) Long Term Goal(s): Improvement in symptoms so as ready for discharge   Short Term Goals: Ability to identify and develop effective coping behaviors will improve Ability to maintain clinical measurements within normal limits will improve Compliance with prescribed  medications will improve Ability to identify changes in lifestyle to reduce recurrence of condition will improve Ability to verbalize feelings will improve Ability to disclose and discuss suicidal ideas Ability to demonstrate self-control will improve  Medication Management: Evaluate patient's response, side effects, and tolerance of medication regimen.  Therapeutic Interventions: 1 to 1 sessions, Unit Group sessions and Medication administration.  Evaluation of Outcomes: Progressing  Physician Treatment Plan for Secondary Diagnosis: Principal Problem:   Schizophrenia spectrum disorder with psychotic disorder type not yet determined (HCC) Active Problems:   Psychosis (HCC)  Long Term Goal(s): Improvement in symptoms so as ready for discharge   Short Term Goals: Ability to identify and develop effective coping behaviors will improve Ability to maintain clinical measurements within normal limits will improve Compliance with prescribed medications will improve Ability to identify changes in lifestyle to reduce recurrence of condition will improve Ability to verbalize feelings will improve Ability to disclose and discuss suicidal ideas Ability to demonstrate self-control will improve     Medication Management: Evaluate patient's response, side effects, and tolerance of medication regimen.  Therapeutic Interventions: 1 to 1 sessions, Unit Group sessions and Medication administration.  Evaluation of Outcomes: Progressing   RN Treatment Plan for Primary Diagnosis: Schizophrenia spectrum disorder with psychotic disorder type not yet determined (HCC) Long Term Goal(s): Knowledge of disease and therapeutic regimen to maintain health will improve  Short Term Goals: Ability to remain free from injury will improve, Ability to verbalize frustration and anger appropriately will improve, Ability to participate in decision making will improve, Ability to verbalize feelings will improve, Ability  to identify and develop effective coping behaviors will improve, and Compliance with prescribed medications will improve  Medication Management: RN will administer medications as ordered by provider, will assess and evaluate patient's response and provide education to patient for prescribed medication. RN will report any adverse and/or side effects to prescribing provider.  Therapeutic Interventions: 1 on 1 counseling sessions, Psychoeducation, Medication administration, Evaluate responses to treatment, Monitor vital signs and CBGs as ordered, Perform/monitor CIWA, COWS, AIMS and Fall Risk screenings as ordered, Perform wound care treatments as ordered.  Evaluation of Outcomes: Progressing   LCSW Treatment Plan for Primary Diagnosis: Schizophrenia spectrum disorder with psychotic disorder type not yet determined (HCC) Long Term Goal(s): Safe transition to appropriate next level of care at discharge, Engage patient in therapeutic group addressing interpersonal concerns.  Short Term Goals: Engage patient in aftercare planning with referrals and resources, Increase social support, Increase emotional regulation, Facilitate acceptance of mental health diagnosis and concerns, Identify triggers associated with mental health/substance abuse issues, and Increase skills for wellness and recovery  Therapeutic Interventions: Assess for all discharge needs, 1 to 1 time with Social worker, Explore available resources and support systems, Assess for adequacy in  community support network, Educate family and significant other(s) on suicide prevention, Complete Psychosocial Assessment, Interpersonal group therapy.  Evaluation of Outcomes: Progressing   Progress in Treatment: Attending groups: Yes. Participating in groups: Yes. Taking medication as prescribed: Yes. Toleration medication: Yes. Family/Significant other contact made: Yes: C- Dorena Bodo (father)  [phone]                                   CLeonia Corona (mother) - [phone] Patient understands diagnosis: Yes. Discussing patient identified problems/goals with staff: Yes. Medical problems stabilized or resolved: Yes. Denies suicidal/homicidal ideation: Yes. Issues/concerns per patient self-inventory: No.    New problem(s) identified: No, Describe:  None reported   New Short Term/Long Term Goal(s): medication stabilization, elimination of SI thoughts, development of comprehensive mental wellness plan.       Patient Goals:  Coping Skills   Discharge Plan or Barriers: Patient recently admitted. CSW will continue to follow and assess for appropriate referrals and possible discharge planning.     Reason for Continuation of Hospitalization: Delusions  Hallucinations Medication stabilization   Estimated Length of Stay: 2-3 Days   Last 3 [place] Suicide Severity Risk Score: Flowsheet Row [facility] (Current[facility] Most recent reading at 12/09/2022  4:15 PM ED to Hosp-Admission (Discharged) from 12/06/2022 in [facility] Most recent reading at 12/08/2022  6:37 PM ED from 12/06/2022 in [facility] Most recent reading at 12/06/2022  7:25 AM  C-SSRS RISK CATEGORY High Risk No Risk No Risk       Last PHQ 2/9 Scores:     No data to display          Scribe for Treatment Team: Beather Arbour 12/16/2022 3:22 PM

## 2022-12-17 NOTE — Progress Notes (Signed)
   12/17/22 1100  Psych Admission Type (Psych Patients Only)  Admission Status Involuntary  Psychosocial Assessment  Patient Complaints Anxiety  Eye Contact Fair  Facial Expression Flat  Affect Anxious  Speech Logical/coherent  Interaction Assertive  Motor Activity Slow  Appearance/Hygiene Unremarkable  Behavior Characteristics Cooperative  Mood Anxious  Thought Process  Coherency WDL  Content WDL  Delusions None reported or observed  Perception WDL  Hallucination None reported or observed  Judgment Limited  Confusion None  Danger to Self  Current suicidal ideation? Denies  Danger to Others  Danger to Others None reported or observed

## 2022-12-17 NOTE — Group Note (Signed)
Date:  12/17/2022 Time:  8:37 PM  Group Topic/Focus:  Wrap-Up Group:   The focus of this group is to help patients review their daily goal of treatment and discuss progress on daily workbooks.    Participation Level:  Active  Participation Quality:  Appropriate  Affect:  Appropriate  Cognitive:  Appropriate  Insight: Appropriate  Engagement in Group:  Engaged  Modes of Intervention:  Exploration  Additional Comments:  Patient attended and participated in group tonight. She reports that the most important thing that happen for her today was that she she is scheduled to be discharged tomorrow.  Kara Nguyen [facility] 12/17/2022, 8:37 PM

## 2022-12-17 NOTE — Progress Notes (Signed)
[facility] MD Progress Note  12/17/2022 5:24 PM Kara Nguyen  MRN:  [id number]  Reason for admission:  This the second psychiatric admission/treatments in this [facility] for this 22 year old AA female with hx of mental illness. Kara Nguyen was a patient in this [facility] last month (April, 2024) from 11-03-22 thru 11-09-22. She was stabilized & discharged on medications with an outpatient psychiatric appointments scheduled at the [facility] in [place], [place]. It is however, unsure at this time if patient was compliant with her outpatient psychiatric recommendations as she currently presents as a poor historian. She is being re-admitted to the [facility] this time around with complain of worsening psychosis & insomnia. And while at the [facility] ED, she was noted to have urinary tract infections after she became feverish. She received some IV fluids/antibiotic treatment & later discharged on oral antibiotics. After medical evaluation/stabilization/clearance, Kara Nguyen was transferred to the [facility] for further psychiatric evaluation & treatments.    24 hr chart review: Sleep Hours last night: 8.25 hours per nursing documentation Nursing Concerns: None Behavioral episodes in the past 24 hrs: None Medication Compliance: Compliant Vital Signs in the past 24 hrs: WNL PRN Medications in the past 24 hrs: None   Daily notes: Kara Nguyen is seen in her room. Chart reviewed. The chart findings discussed with the treatment team. She presents alert with an improved affect/eye contact. She was in the day room with the other patients finishing up morning group sessions. She reports, "I'm doing pretty good, better. My mood is kind of all over the place" When asked patient to explain how she means & what that feels like? She replies, "my mood is a bit racy, but not a lot. I have heard a voice today, a good voice. I'm not depressed & I have no anxiety. I'm doing pretty good on the medicines. No side effects". During this evaluation, Kara Nguyen presents  what seems like echolalia (she repeated to the exact, the two statements this provide made. Such as when this provider greeted [employer] & informed her that we will conduct this evaluation in her room. She repeated the exact statement in the same manner. Discussed this with the attending psychiatrist. The attending psychiatrist did meet with patient to re-evaluate. He reported back that patient appears to be doing well. If Kara Nguyen maintains mood stability by tomorrow morning, she will be discharged home to her family. There are currently no changes made on her current plan of care, will continue as already in progress. She reports good sleep, good appetite & no side effects from her medications. She received her long acting injectable yesterday. This medication will be due again in 28 days. Continue as already in progress.    Per previous notes: We have given pt Risperdal Uzedi LAI 100 mg toaday at 1319. We plan to monitor for the next 24 hrs, and discharge pt pending safety planning being completed and outpatient appointments being completed.   Principal Problem: Schizophrenia spectrum disorder with psychotic disorder type not yet determined (HCC)  Diagnosis: Principal Problem:   Schizophrenia spectrum disorder with psychotic disorder type not yet determined (HCC) Active Problems:   Psychosis (HCC)  Total Time spent with patient:  35 minutes  Past Psychiatric History: See H&P  Past Medical History:  Past Medical History:  Diagnosis Date   Anxiety    Bipolar disorder (HCC)    Deliberate self-cutting    Depression    History reviewed. No pertinent surgical history. Family History:  Family History  Problem Relation Age  of Onset   Cancer Other    Diabetes Paternal Grandmother    Family Psychiatric  History: See H&P.  Social History:  Social History   Substance and Sexual Activity  Alcohol Use Never   Alcohol/week: 0.0 standard drinks of alcohol     Social History   Substance and  Sexual Activity  Drug Use Not Currently   Types: Solvent inhalants    Social History   Socioeconomic History   Marital status: Single    Spouse name: Not on file   Number of children: 0   Years of education: Not on file   Highest education level: 10th grade  Occupational History   Occupation: Consulting civil engineer  Tobacco Use   Smoking status: Never   Smokeless tobacco: Never  Vaping Use   Vaping Use: Never used  Substance and Sexual Activity   Alcohol use: Never    Alcohol/week: 0.0 standard drinks of alcohol   Drug use: Not Currently    Types: Solvent inhalants   Sexual activity: Never  Other Topics Concern   Not on file  Social History Narrative   She lives with mother, father, and sibling.     Social Determinants of Health   Financial Resource Strain: Not on file  Food Insecurity: Patient Declined (12/09/2022)   Hunger Vital Sign    Worried About Running Out of Food in the Last Year: Patient declined    Ran Out of Food in the Last Year: Patient declined  Transportation Needs: No Transportation Needs (12/09/2022)   PRAPARE - Administrator, Civil Service (Medical): No    Lack of Transportation (Non-Medical): No  Physical Activity: Not on file  Stress: Not on file  Social Connections: Not on file   Additional Social History:   Sleep: Good  Appetite:  Fair  Current Medications: Current Facility-Administered Medications  Medication Dose Route Frequency Provider Last Rate Last Admin   acetaminophen (TYLENOL) tablet 650 mg  650 mg Oral Q6H PRN Starkes-Perry, Juel Burrow, FNP       alum & mag hydroxide-simeth (MAALOX/MYLANTA) 200-200-20 MG/5ML suspension 30 mL  30 mL Oral Q4H PRN Starkes-Perry, Juel Burrow, FNP       diphenhydrAMINE (BENADRYL) capsule 50 mg  50 mg Oral TID PRN Maryagnes Amos, FNP   50 mg at 12/10/22 2348   Or   diphenhydrAMINE (BENADRYL) injection 50 mg  50 mg Intramuscular TID PRN Maryagnes Amos, FNP   50 mg at 12/10/22 0201   haloperidol  (HALDOL) tablet 5 mg  5 mg Oral TID PRN Maryagnes Amos, FNP   5 mg at 12/10/22 2348   Or   haloperidol lactate (HALDOL) injection 5 mg  5 mg Intramuscular TID PRN Maryagnes Amos, FNP   5 mg at 12/10/22 0202   hydrOXYzine (ATARAX) tablet 50 mg  50 mg Oral Once Armandina Stammer I, NP       hydrOXYzine (ATARAX) tablet 50 mg  50 mg Oral TID PRN Armandina Stammer I, NP   50 mg at 12/14/22 2228   LORazepam (ATIVAN) tablet 2 mg  2 mg Oral TID PRN Maryagnes Amos, FNP   2 mg at 12/13/22 1650   Or   LORazepam (ATIVAN) injection 2 mg  2 mg Intramuscular TID PRN Maryagnes Amos, FNP   2 mg at 12/10/22 0202   LORazepam (ATIVAN) tablet 0.5 mg  0.5 mg Oral Q12H Massengill, Harrold Donath, MD   0.5 mg at 12/17/22 0754   magnesium hydroxide (MILK  OF MAGNESIA) suspension 30 mL  30 mL Oral Daily PRN Starkes-Perry, Juel Burrow, FNP       propranolol (INDERAL) tablet 20 mg  20 mg Oral Q8H Massengill, Nathan, MD   20 mg at 12/17/22 1343   risperiDONE (RISPERDAL M-TABS) disintegrating tablet 2 mg  2 mg Oral QHS Massengill, Nathan, MD   2 mg at 12/16/22 2045   risperiDONE ER SUSY 100 mg  100 mg Subcutaneous Q28 days Massengill, Harrold Donath, MD   100 mg at 12/16/22 1319   sertraline (ZOLOFT) tablet 50 mg  50 mg Oral Daily Massengill, Harrold Donath, MD   50 mg at 12/17/22 0753   traZODone (DESYREL) tablet 100 mg  100 mg Oral QHS,MR X 1 Maryagnes Amos, FNP   100 mg at 12/16/22 2044   Lab Results:  No results found for this or any previous visit (from the past 48 hour(s)).   Blood Alcohol level:  Lab Results  Component Value Date   ETH <10 12/06/2022   ETH <10 11/01/2022    Metabolic Disorder Labs: Lab Results  Component Value Date   HGBA1C 4.8 11/06/2022   MPG 91.06 11/06/2022   MPG 93.93 01/16/2018   Lab Results  Component Value Date   PROLACTIN 29.5 (H) 01/10/2018   Lab Results  Component Value Date   CHOL 118 11/05/2022   TRIG 59 11/05/2022   HDL 49 11/05/2022   CHOLHDL 2.4 11/05/2022    VLDL 12 11/05/2022   LDLCALC 57 11/05/2022   LDLCALC 55 01/16/2018    Physical Findings: AIMS: Facial and Oral Movements Muscles of Facial Expression: None, normal Lips and Perioral Area: None, normal Jaw: None, normal Tongue: None, normal,Extremity Movements Upper (arms, wrists, hands, fingers): None, normal Lower (legs, knees, ankles, toes): None, normal, Trunk Movements Neck, shoulders, hips: None, normal, Overall Severity Severity of abnormal movements (highest score from questions above): None, normal Incapacitation due to abnormal movements: None, normal Patient's awareness of abnormal movements (rate only patient's report): No Awareness, Dental Status Current problems with teeth and/or dentures?: No Does patient usually wear dentures?: No  CIWA:    COWS:     Musculoskeletal: Strength & Muscle Tone: within normal limits Gait & Station: normal Patient leans: N/A  Psychiatric Specialty Exam:  Presentation  General Appearance:  Appropriate for Environment; Fairly Groomed  Eye Contact: Good  Speech: Clear and Coherent  Speech Volume: Normal  Handedness: Right   Mood and Affect  Mood: Depressed  Affect: Congruent   Thought Process  Thought Processes: Coherent  Descriptions of Associations:Intact  Orientation:Full (Time, Place and Person)  Thought Content:Logical  History of Schizophrenia/Schizoaffective disorder:Yes  Duration of Psychotic Symptoms:Greater than six months  Hallucinations:Hallucinations: None   Ideas of Reference:None  Suicidal Thoughts:Suicidal Thoughts: No   Homicidal Thoughts:Homicidal Thoughts: No    Sensorium  Memory: Immediate Good  Judgment: Fair  Insight: Fair   Chartered certified accountant: Fair  Attention Span: Fair  Recall: [employer] of Knowledge: Fair  Language: Fair   Psychomotor Activity  Psychomotor Activity: Psychomotor Activity: Normal   Assets   Assets: Manufacturing systems engineer; Housing; Social Support  Sleep  Sleep: Sleep: Good  Physical Exam: Physical Exam Vitals and nursing note reviewed.  HENT:     Mouth/Throat:     Pharynx: Oropharynx is clear.  Pulmonary:     Effort: Pulmonary effort is normal.  Genitourinary:    Comments: Deferred Musculoskeletal:        General: Normal range of motion.  Cervical back: Normal range of motion.  Skin:    General: Skin is warm.  Neurological:     General: No focal deficit present.     Mental Status: She is alert and oriented to person, place, and time.    Review of Systems  Constitutional:  Negative for chills and fever.  HENT:  Negative for congestion and sore throat.   Respiratory:  Negative for cough, shortness of breath and wheezing.   Cardiovascular:  Negative for chest pain and palpitations.  Gastrointestinal:  Negative for abdominal pain, constipation, diarrhea, heartburn, nausea and vomiting.  Musculoskeletal:  Negative for joint pain and myalgias.  Neurological:  Negative for dizziness, tingling, tremors, sensory change, speech change, focal weakness, seizures, loss of consciousness, weakness and headaches.  Psychiatric/Behavioral:  Positive for depression. Negative for hallucinations, memory loss, substance abuse and suicidal ideas. The patient is nervous/anxious and has insomnia.    Blood pressure (!) 105/48, pulse 80, temperature 98.1 F (36.7 C), temperature source Oral, resp. rate 16, height 5\' 9"  (1.753 m), weight 110.5 kg, SpO2 100 %. Body mass index is 35.97 kg/m.  Treatment Plan Summary: Daily contact with patient to assess and evaluate symptoms and progress in treatment and Medication management.   Continue inpatient hospitalization.  Will continue today 12/17/2022 plan as below except where it is noted.   Principal/active diagnoses.  Schizophrenia spectrum disorder with psychotic disorder type not yet determined.    Associated symptoms.  Psychosis.   Insomnia. Agitation.  Plan:  -Risperdal Uzedi 100 mg LAI given today, 6/5 @@ 1319 for mood stabilization/psychosis due again in 28 days. -Continue Risperdal M-tab  to 2 mg daily for mood stabilization & psychosis. -Continue Zoloft 50 mg daily for depressive symptoms -Continue Inderal 20 mg Q 8 H for tachycardia  -Continue Ativan 0.5 mg BID for catatonia  -Continue Trazodone 100 mg po Q hs prn, may repeat x 1 for insomnia.  -Continue Hydroxyzine 25 mg po tid prn for anxiety.    Agitation protocols: Cont as recommended;  -Benadryl 50 mg po or IM tid prn. -Haldol 5 mg po or IM tid prn.  -Lorazepam 2 mg po or IM tid prn.   Other medical issues.  -Continue Bactrim DS 800-160 po bid for uti x 6 doses.   Other PRNS -Continue Tylenol 650 mg every 6 hours PRN for mild pain -Continue Maalox 30 ml Q 4 hrs PRN for indigestion -Continue MOM 30 ml po Q 6 hrs for constipation   Safety and Monitoring: Voluntary admission to inpatient psychiatric unit for safety, stabilization and treatment Daily contact with patient to assess and evaluate symptoms and progress in treatment Patient's case to be discussed in multi-disciplinary team meeting Observation Level : q15 minute checks Vital signs: q12 hours Precautions: Safety   Discharge Planning: Social work and case management to assist with discharge planning and identification of hospital follow-up needs prior to discharge Estimated LOS: 5-7 days Discharge Concerns: Need to establish a safety plan; Medication compliance and effectiveness Discharge Goals: Return home with outpatient referrals for mental health follow-up including medication management/psychotherapy  Armandina Stammer, NP, pmhnp 12/17/2022, 5:24 PMPatient ID: Kara Nguyen, female   DOB: 03/28/2001, 21 y.o.  Patient ID: Kara Nguyen, female   DOB: 07-Aug-2000, 21 y.o.   MRN: [id number] Patient ID: Kara Nguyen, female   DOB: 01/30/2001, 21 y.o.   MRN: [id number]

## 2022-12-17 NOTE — Group Note (Signed)
Date:  12/17/2022 Time:  10:15 AM  Group Topic/Focus:  Goals Group:   The focus of this group is to help patients establish daily goals to achieve during treatment and discuss how the patient can incorporate goal setting into their daily lives to aide in recovery. Orientation:   The focus of this group is to educate the patient on the purpose and policies of crisis stabilization and provide a format to answer questions about their admission.  The group details unit policies and expectations of patients while admitted.    Participation Level:  Active  Participation Quality:  Appropriate  Affect:  Appropriate  Cognitive:  Appropriate  Insight: Appropriate  Engagement in Group:  Engaged  Modes of Intervention:  Discussion  Additional Comments:  Patient attended morning orientation/goal group and said that her goal for today is to folllow her care plan.   Haruye Lainez W Minard Millirons 12/17/2022, 10:15 AM

## 2022-12-17 NOTE — Group Note (Signed)
Recreation Therapy Group Note   Group Topic:Team Building  Group Date: 12/17/2022 Start Time: 1003 End Time: 1030 Facilitators: Kielyn Kardell-McCall, LRT,CTRS Location: [address] Dayroom   Goal Area(s) Addresses:  Patient will effectively work with peer towards shared goal.  Patient will identify skills used to make activity successful.  Patient will identify how skills used during activity can be applied to reach post d/c goals.   Group Description: [employer]. In teams of 5-6, patients were given 11 craft pipe cleaners. Using the materials provided, patients were instructed to compete again the opposing team(s) to build the tallest free-standing structure from floor level. The activity was timed; difficulty increased by Clinical research associate as Production designer, theatre/television/film continued.  Systematically resources were removed with additional directions for example, placing one arm behind their back, working in silence, and shape stipulations. LRT facilitated post-activity discussion reviewing team processes and necessary communication skills involved in completion. Patients were encouraged to reflect how the skills utilized, or not utilized, in this activity can be incorporated to positively impact support systems post discharge.   Affect/Mood: Appropriate   Participation Level: Engaged   Participation Quality: Independent   Behavior: Appropriate   Speech/Thought Process: Rational   Insight: Moderate   Judgement: Moderate   Modes of Intervention: STEM Activity   Patient Response to Interventions:  Engaged   Education Outcome:  Acknowledges education   Clinical Observations/Individualized Feedback: Pt was attentive and worked well with peers at starting the structure.  Pt eventually started taking the tower apart causing peer to lose interest and give up.      Plan: Continue to engage patient in RT group sessions 2-3x/week.   Kielyn Kardell-McCall, LRT,CTRS 12/17/2022 12:28 PM

## 2022-12-17 NOTE — Progress Notes (Signed)
   12/17/22 2044  Psych Admission Type (Psych Patients Only)  Admission Status Involuntary  Psychosocial Assessment  Patient Complaints Anxiety  Eye Contact Fair  Facial Expression Flat  Affect Anxious  Speech Logical/coherent  Interaction Assertive  Motor Activity Slow  Appearance/Hygiene Unremarkable  Behavior Characteristics Cooperative  Mood Pleasant;Anxious  Thought Process  Coherency WDL  Content WDL  Delusions None reported or observed  Perception WDL  Hallucination None reported or observed  Judgment Limited  Confusion None  Danger to Self  Current suicidal ideation? Denies  Danger to Others  Danger to Others None reported or observed

## 2022-12-17 NOTE — Progress Notes (Signed)
   12/17/22 0615  15 Minute Checks  Location Dayroom  Visual Appearance Calm  Behavior Composed  Sleep (Behavioral Health Patients Only)  Calculate sleep? (Click Yes once per 24 hr at 0600 safety check) Yes  Documented sleep last 24 hours 8.5

## 2022-12-17 NOTE — Plan of Care (Signed)
  Problem: Education: Goal: Emotional status will improve Outcome: Progressing Goal: Mental status will improve Outcome: Progressing   Problem: Coping: Goal: Ability to demonstrate self-control will improve Outcome: Progressing   Problem: Coping: Goal: Coping ability will improve Outcome: Progressing Goal: Will verbalize feelings Outcome: Progressing   Problem: Self-Concept: Goal: Level of anxiety will decrease Outcome: Progressing

## 2022-12-18 MED ORDER — LORAZEPAM 0.5 MG PO TABS: 0.5000 mg | ORAL_TABLET | ORAL | 0 refills | Status: AC

## 2022-12-18 MED ORDER — HYDROXYZINE HCL 50 MG PO TABS: 50.0000 mg | ORAL_TABLET | Freq: Three times a day (TID) | ORAL | 0 refills | Status: AC | PRN

## 2022-12-18 MED ORDER — UZEDY 100 MG/0.28ML ~~LOC~~ SUSY: 100.0000 mg | PREFILLED_SYRINGE | SUBCUTANEOUS | 0 refills | Status: AC

## 2022-12-18 MED ORDER — TRAZODONE HCL 100 MG PO TABS
100.0000 mg | ORAL_TABLET | Freq: Every day | ORAL | Status: DC
  Filled 2022-12-18 (×2): qty 1

## 2022-12-18 MED ORDER — SERTRALINE HCL 50 MG PO TABS: 50.0000 mg | ORAL_TABLET | Freq: Every day | ORAL | 0 refills | Status: AC

## 2022-12-18 MED ORDER — TRAZODONE HCL 100 MG PO TABS: 100.0000 mg | ORAL_TABLET | Freq: Every day | ORAL | 0 refills | Status: AC

## 2022-12-18 MED ORDER — RISPERIDONE 1 MG PO TABS: 2.0000 mg | ORAL_TABLET | ORAL | 0 refills | Status: AC

## 2022-12-18 MED ORDER — PROPRANOLOL HCL ER 60 MG PO CP24: 60.0000 mg | ORAL_CAPSULE | Freq: Every day | ORAL | 0 refills | Status: AC

## 2022-12-18 MED ORDER — RISPERIDONE 2 MG PO TABS
2.0000 mg | ORAL_TABLET | Freq: Every day | ORAL | Status: DC
  Filled 2022-12-18: qty 1

## 2022-12-18 NOTE — BHH Group Notes (Signed)
Spirituality group facilitated by Kathleen Argue, BCC.  Group Description: Group focused on topic of hope. Patients participated in facilitated discussion around topic, connecting with one another around experiences and definitions for hope. Group members engaged with visual explorer photos, reflecting on what hope looks like for them today. Group engaged in discussion around how their definitions of hope are present today in hospital.  Modalities: Psycho-social ed, Adlerian, Narrative, MI  Patient Progress: Kara Nguyen attended the first part of group before she was discharged.  She participated and actively engaged in the group conversation, stating that she was feeling hopeful that she was going home.

## 2022-12-18 NOTE — Progress Notes (Signed)
With pt permission, I called the pt's father [facility] [phone]) and brother [phone]) who speaks english - We discussed the pt's diagnosis, hospital course, treatment, response to treatment, and discharge planning. At the end of the call, the caller had no further questions.    They are agreeable with the medication plan and plan for discharge.

## 2022-12-18 NOTE — Progress Notes (Signed)
   12/18/22 0536  15 Minute Checks  Location Bedroom  Visual Appearance Calm  Behavior Sleeping  Sleep (Behavioral Health Patients Only)  Calculate sleep? (Click Yes once per 24 hr at 0600 safety check) Yes  Documented sleep last 24 hours 10

## 2022-12-18 NOTE — Progress Notes (Signed)
  [facility] Adult Case Management Discharge Plan :  Will you be returning to the same living situation after discharge:  Yes,  home At discharge, do you have transportation home?: Yes,  father will pick patient up  Do you have the ability to pay for your medications: Yes,  insurance   Release of information consent forms completed and in the chart;  Patient's signature needed at discharge.  Patient to Follow up at:  Follow-up Information     [facility] Go on 12/21/2022.   Why: You have an appointment for an assessment on 12/21/22 at 10:00 am.  This appointment will be held in person. Following this appointment you will be scheduled for a clinical assessment to obtain therapy and medication management services. Contact information: [address] [place] [address] [phone]         Services, Psychotherapeutic. Call.   Why: Your careteam is recommending services at [facility], a day program, for ongoing services.  In order to qualify for services, you will need to change your [facility].  We offered to assist with this process and you declined stating that you did not want to switch your medicaid.  If you change your mind, please reach out and see if program can assist with changing medicaid. Contact information: [address] [place] [address] [phone]                 Next level of care provider has access to [facility] Link:No  Safety Planning and Suicide Prevention discussed: Yes,  Father, Irving Shows     Has patient been referred to the Quitline?: Patient does not use tobacco/nicotine products  Patient has been referred for addiction treatment: No known substance use disorder.  Vernor Monnig E Dan Dissinger, LCSW 12/18/2022, 9:36 AM

## 2022-12-18 NOTE — Progress Notes (Signed)
   12/18/22 0604  Vital Signs  Pulse Rate 87  BP (!) 84/58  BP Location Right Arm  BP Method Automatic  Patient Position (if appropriate) Standing    Patient asymptomatic.  Hold Propranolol.  Hydrated patient with Gatorade.  Will request recheck by Day shift RN.

## 2022-12-18 NOTE — Group Note (Signed)
Recreation Therapy Group Note   Group Topic:Goal Setting  Group Date: 12/18/2022 Start Time: 0955 End Time: 1020 Facilitators: Hewitt Garner-McCall, LRT,CTRS Location: [address] Dayroom   Goal Area(s) Addresses:  Patient will participate in discussion of what a goal is. Patient will successfully identify goals and obstacles.  Group Description: DREAMS.  LRT and patients discussed what goals are and why they are important.  Patients were then given a worksheet with the word dream in large block letters.  Patients were to write their goals inside the letters such what they want to be or what they want out of life.  Patients would then write the obstacles on the outside of the letters and at the bottom of the paper, identify ways to overcome those challenges.    Affect/Mood: Appropriate   Participation Level: Engaged   Participation Quality: Independent   Behavior: Appropriate   Speech/Thought Process: Focused   Insight: Good   Judgement: Good   Modes of Intervention: Worksheet   Patient Response to Interventions:  Engaged   Education Outcome:  Acknowledges education   Clinical Observations/Individualized Feedback: Pt was bright, clear and focused.  Pt was appropriate throughout group session.  Pt was pleasant and anticipating her discharge.  Pt shared she wanted to be an Event organiser of romance movies.  Pt also wants to be an actor and dreamer.  Pt stated her biggest obstacle was constantly working.    Plan: Continue to engage patient in RT group sessions 2-3x/week.   Yovan Leeman-McCall, LRT,CTRS 12/18/2022 10:46 AM

## 2022-12-18 NOTE — BHH Suicide Risk Assessment (Signed)
Suicide Risk Assessment  Discharge Assessment    [facility] Discharge Suicide Risk Assessment   Principal Problem: Schizophrenia spectrum disorder with psychotic disorder type not yet determined [facility])  Discharge Diagnoses: Principal Problem:   Schizophrenia spectrum disorder with psychotic disorder type not yet determined (HCC) Active Problems:   Psychosis (HCC)  Total Time spent with patient:  Greater than 30 minutes.  Musculoskeletal: Strength & Muscle Tone: within normal limits Gait & Station: normal Patient leans: N/A  Psychiatric Specialty Exam  Presentation  General Appearance:  Appropriate for Environment; Casual; Fairly Groomed  Eye Contact: Good  Speech: Normal Rate; Clear and Coherent  Speech Volume: Normal  Handedness: Right   Mood and Affect  Mood: Euthymic  Duration of Depression Symptoms: No data recorded  Affect: Appropriate; Congruent; Full Range  Thought Process  Thought Processes: Linear  Descriptions of Associations:Intact  Orientation:Full (Time, Place and Person)  Thought Content:Logical  History of Schizophrenia/Schizoaffective disorder:Yes  Duration of Psychotic Symptoms:Greater than six months  Hallucinations:Hallucinations: Auditory  Ideas of Reference:None  Suicidal Thoughts:Suicidal Thoughts: No  Homicidal Thoughts:Homicidal Thoughts: No  Sensorium  Memory: Immediate Good; Recent Good; Remote Good  Judgment: Fair  Insight: Fair  Chartered certified accountant: Fair  Attention Span: Fair  Recall: Good  Fund of Knowledge: Good  Language: Good  Psychomotor Activity  Psychomotor Activity: Psychomotor Activity: Normal  Assets  Assets: Communication Skills; Housing; Social Support  Sleep  Sleep: Sleep: Fair  Physical Exam: See Discharge summary. Blood pressure 112/65, pulse 82, temperature 98.3 F (36.8 C), temperature source Oral, resp. rate 16, height 5\' 9"  (1.753 m), weight 110.5 kg,  SpO2 100 %. Body mass index is 35.97 kg/m.  Mental Status Per Nursing Assessment::   On Admission:  Suicidal ideation indicated by patient, Self-harm thoughts  Demographic Factors:  Adolescent or young adult, Low socioeconomic status, and Unemployed  Loss Factors: NA  Historical Factors: NA  Risk Reduction Factors:   Sense of responsibility to family, Religious beliefs about death, Living with another person, especially a relative, Positive social support, Positive therapeutic relationship, and Positive coping skills or problem solving skills  Continued Clinical Symptoms:  Schizophrenia:   Paranoid or undifferentiated type Previous Psychiatric Diagnoses and Treatments  Cognitive Features That Contribute To Risk:  Closed-mindedness, Polarized thinking, and Thought constriction (tunnel vision)    Suicide Risk:  Minimal: No identifiable suicidal ideation.  Patients presenting with no risk factors but with morbid ruminations; may be classified as minimal risk based on the severity of the depressive symptoms   Follow-up Information     [facility] Go on 12/21/2022.   Why: You have an appointment for an assessment on 12/21/22 at 10:00 am.  This appointment will be held in person. Following this appointment you will be scheduled for a clinical assessment to obtain therapy and medication management services. Contact information: [address] [place] [address] [phone]         Services, Psychotherapeutic. Call.   Why: Your careteam is recommending services at [facility], a day program, for ongoing services.  In order to qualify for services, you will need to change your [facility] to [facility].  We offered to assist with this process and you declined stating that you did not want to switch your medicaid.  If you change your mind, please reach out and see if program can assist with changing medicaid. Contact information: [address] [place] [address] [phone]  Plan Of Care/Follow-up recommendations:  See the discharge recommendations above.  Armandina Stammer, NP, pmhnp, fnp-bc. 12/18/2022, 9:24 AM

## 2022-12-18 NOTE — Progress Notes (Signed)
Pt discharged at this time. Pt removed all belongings, prescriptions, and pt and her family verbalized understanding of medications and discharge instructions. Pt denies AI/HI/AVH.

## 2022-12-18 NOTE — Group Note (Signed)
Date:  12/18/2022 Time:  10:07 AM  Group Topic/Focus:  Goals Group:   The focus of this group is to help patients establish daily goals to achieve during treatment and discuss how the patient can incorporate goal setting into their daily lives to aide in recovery. Orientation:   The focus of this group is to educate the patient on the purpose and policies of crisis stabilization and provide a format to answer questions about their admission.  The group details unit policies and expectations of patients while admitted.    Participation Level:  Active  Participation Quality:  Appropriate  Affect:  Appropriate  Cognitive:  Appropriate  Insight: Appropriate  Engagement in Group:  Engaged  Modes of Intervention:  Discussion  Additional Comments:  Pt attended morning orientation/goal group and said her goal for today is to follow her care plan.  Renea Schoonmaker W Mekia Dipinto 12/18/2022, 10:07 AM

## 2022-12-18 NOTE — Discharge Instructions (Addendum)
-  Follow-up with your outpatient psychiatric provider -instructions on appointment date, time, and address (location) are provided to you in discharge paperwork.  -Take your psychiatric medications as prescribed at discharge - instructions are provided to you in the discharge paperwork  -Taper off of your oral risperdal as follows: continue risperdal 2 mg at bedtime for 2 days. Then decrease the dose to 1 mg at bedtime for 2 days. Then stop oral risperdal.  -Your next dose of risperdal Uzedy 125 mg [facility] [employer] is due on 01-15-23. -Taper off of your Ativan as follows: Continue Ativan 0.5 mg twice daily for 3 days.  Then decrease to 0.5 mg once daily for 4 days. Then stop.   -Follow-up with outpatient primary care doctor and other specialists -for management of preventative medicine and any chronic medical disease.  -Recommend abstinence from alcohol, tobacco, and other illicit drug use at discharge.   -If your psychiatric symptoms recur, worsen, or if you have side effects to your psychiatric medications, call your outpatient psychiatric provider, 911, [phone] or go to the nearest emergency department.  -If suicidal thoughts occur, call your outpatient psychiatric provider, 911, [phone] or go to the nearest emergency department.  Naloxone (Narcan) can help reverse an overdose when given to the victim quickly.  [facility] offers free naloxone kits and instructions/training on its use.  Add naloxone to your first aid kit and you can help save a life.   Pick up your free kit at the following locations:   [place]:  [facility], [address] [place] [address] [phone]) [facility] [address] [place] [address] [phone]) [facility] [address] [place] [address]  [place]: [facility] [address] [place] [address]  [phone]) [facility] [name] [place] [address] [phone])

## 2022-12-18 NOTE — Discharge Summary (Signed)
Physician Discharge Summary Note  Patient:  Kara Nguyen is an 22 y.o., female MRN:  [id number] DOB:  10-05-2000 Patient phone:  [phone] (home)  Patient address:   [address] [place] [address],   Total Time spent with patient:  Greater than 30 minutes  Date of Admission:  12/09/2022  Date of Discharge: 12-18-22  Reason for Admission: Worsening psychosis & insomnia.   Principal Problem: Schizophrenia spectrum disorder with psychotic disorder type not yet determined [facility])  Discharge Diagnoses: Principal Problem:   Schizophrenia spectrum disorder with psychotic disorder type not yet determined (HCC) Active Problems:   Psychosis (HCC)  Past Psychiatric History: Schizophrenia spectrum disorder with psychotic features.  Past Medical History:  Past Medical History:  Diagnosis Date   Anxiety    Bipolar disorder (HCC)    Deliberate self-cutting    Depression    History reviewed. No pertinent surgical history. Family History:  Family History  Problem Relation Age of Onset   Cancer Other    Diabetes Paternal Grandmother    Family Psychiatric  History: See H&P.  Social History:  Social History   Substance and Sexual Activity  Alcohol Use Never   Alcohol/week: 0.0 standard drinks of alcohol     Social History   Substance and Sexual Activity  Drug Use Not Currently   Types: Solvent inhalants    Social History   Socioeconomic History   Marital status: Single    Spouse name: Not on file   Number of children: 0   Years of education: Not on file   Highest education level: 10th grade  Occupational History   Occupation: Consulting civil engineer  Tobacco Use   Smoking status: Never   Smokeless tobacco: Never  Vaping Use   Vaping Use: Never used  Substance and Sexual Activity   Alcohol use: Never    Alcohol/week: 0.0 standard drinks of alcohol   Drug use: Not Currently    Types: Solvent inhalants   Sexual activity: Never  Other Topics Concern   Not on file   Social History Narrative   She lives with mother, father, and sibling.     Social Determinants of Health   Financial Resource Strain: Not on file  Food Insecurity: Patient Declined (12/09/2022)   Hunger Vital Sign    Worried About Running Out of Food in the Last Year: Patient declined    Ran Out of Food in the Last Year: Patient declined  Transportation Needs: No Transportation Needs (12/09/2022)   PRAPARE - Administrator, Civil Service (Medical): No    Lack of Transportation (Non-Medical): No  Physical Activity: Not on file  Stress: Not on file  Social Connections: Not on file   Hospital Course: (Per admission evaluation notes): This the second psychiatric admission/treatments in this [facility] for this 22 year old AA female with hx of mental illness. Kara Nguyen was a patient in this [facility] last month (April, 2024) from 11-03-22 thru 11-09-22. She was stabilized & discharged on medications with an outpatient psychiatric appointments scheduled at the [facility] in [place], [place]. It is however, unsure at this time if patient was compliant with her outpatient psychiatric recommendations as she currently presents as a poor historian. She is being re-admitted to the [facility] this time around with complain of worsening psychosis & insomnia. And while at the [facility] ED, she was noted to have urinary tract infections after she became feverish. She received some IV fluids/antibiotic treatment & later discharged on oral antibiotics. After medical evaluation/stabilization/clearance, [place]  was transferred to the [facility] for further psychiatric evaluation & treatments. During this evaluation, Kara Nguyen reports, "I'm catholic, dealing with a lot of emotions (anxiety, depression & insomnia). All of these started a month ago. They were triggered by my dad or something. We had a fight, but it is okay. My dad took me to the [facility] health. I have not been taking my medicines in 6 years. I did not have time to love. I'm  feeling free now. At the moment, I'm not hearing voices or seeing things. I was sleeping well, but I did not sleep last night".   This is the second psychiatric admission/discharge summaries for this 22 year old Hispanic female from [facility] in less than 6 weeks. She was a patient in this [facility] in April of this year, 2024 for psychiatric evaluation & treatments of worsening psychosis. At that time, she was treated, stabilized & discharged with an outpatient psychiatric services/medication management.  Chart review indicated that patient may not have been compliant with her recommended treatment regimen leading to the return of her symptoms.   After evaluation of her presenting symptoms this time around, it was jointly agreed by the treatment team to recommend [facility] for mood stabilization treatments as she presented highly psychotic, disorganized, illogical & was responding to internal stimuli. The medication regimen targeting those presenting symptoms were discussed & initiated with her consent. She was, treated, stabilized & discharged on the medications as listed below on her discharge medication lists. [place] was also enrolled & participated in the group counseling sessions being offered & held on this unit. She learned coping skills. She presented other medical issues (uti) that required treatment & or monitoring. She received antibiotic treatment. She tolerated her treatment regimen without any adverse effects or reactions reported.    Kara Nguyen's symptoms responded well to her treatment regimen warranting this discharge. This is evidenced by her daily reports of improved mood, resolution of symptoms, presentation of good affect/eye contact & absence of psychosis. She currently presents mentally & medically stable for discharge to continue mental health care/medication management on an outpatient basis as recommended below. Patient was treated with Risperdal tablets that transitioned to the monthly injectable  format prior to this discharge to help improve/promote compliance. She is discharged on a temporary oral form of risperdal for few days while the injectable format is getting into her system. Below are the instruction on how to taper off the oral form of Risperdal while she is maintained on the monthly Risperdal injectable moving forward..  -Taper off of your oral risperdal as follows: continue risperdal 2 mg at bedtime for 2 days. Then decrease the dose to 1 mg at bedtime for 2 days. Then stop oral risperdal.  -Your next dose of risperdal Uzedy 125 mg Fran [employer]es is due on 01-15-23. -Taper off of your Ativan as follows: Continue Ativan 0.5 mg twice daily for 3 days.  Then decrease to 0.5 mg once daily for 4 days. Then stop.    Today upon her discharge evaluation with her treatment team, Carlee shares, "I'm doing good. I feel much better". She denies any specific concerns. She is sleeping well. Her appetite is good. She denies any other physical complaints. She denies SI/HI/AH/VH, delusional thoughts or paranoia. She is tolerating her medications well & in agreement to continue her current regimen as recommended. She will follow up for routine psychiatric care & medication management as noted below. She is provided with all the necessary information needed to make these appointments without problems.  She was able to engage in safety planning including plan to return to [facility] or contact emergency services if she feels unable to maintain her own safety or the safety of others. Pt had no further questions, comments or concerns. She left [facility] with all personal belongings in no apparent distress. Transportation per family (Father).  Physical Findings: AIMS: Facial and Oral Movements Muscles of Facial Expression: None, normal Lips and Perioral Area: None, normal Jaw: None, normal Tongue: None, normal,Extremity Movements Upper (arms, wrists, hands, fingers): None, normal Lower (legs, knees, ankles, toes): None, normal,  Trunk Movements Neck, shoulders, hips: None, normal, Overall Severity Severity of abnormal movements (highest score from questions above): None, normal Incapacitation due to abnormal movements: None, normal Patient's awareness of abnormal movements (rate only patient's report): No Awareness, Dental Status Current problems with teeth and/or dentures?: No Does patient usually wear dentures?: No  CIWA:    COWS:     Musculoskeletal: Strength & Muscle Tone: within normal limits Gait & Station: normal Patient leans: N/A   Psychiatric Specialty Exam:  Presentation  General Appearance:  Appropriate for Environment; Casual; Fairly Groomed  Eye Contact: Good  Speech: Normal Rate; Clear and Coherent  Speech Volume: Normal  Handedness: Right   Mood and Affect  Mood: Euthymic  Affect: Appropriate; Congruent; Full Range   Thought Process  Thought Processes: Linear  Descriptions of Associations:Intact  Orientation:Full (Time, Place and Person)  Thought Content:Logical  History of Schizophrenia/Schizoaffective disorder:Yes  Duration of Psychotic Symptoms:Greater than six months  Hallucinations:Hallucinations: Auditory  Ideas of Reference:None  Suicidal Thoughts:Suicidal Thoughts: No  Homicidal Thoughts:Homicidal Thoughts: No   Sensorium  Memory: Immediate Good; Recent Good; Remote Good  Judgment: Fair  Insight: Fair   Chartered certified accountant: Fair  Attention Span: Fair  Recall: Good  Fund of Knowledge: Good  Language: Good   Psychomotor Activity  Psychomotor Activity: Psychomotor Activity: Normal   Assets  Assets: Communication Skills; Housing; Social Support   Sleep  Sleep: Sleep: Fair   Physical Exam: Physical Exam Vitals and nursing note reviewed.  HENT:     Head: Normocephalic.     Nose: Nose normal.     Mouth/Throat:     Pharynx: Oropharynx is clear.  Eyes:     Pupils: Pupils are equal, round, and  reactive to light.  Cardiovascular:     Rate and Rhythm: Normal rate.     Pulses: Normal pulses.  Pulmonary:     Effort: Pulmonary effort is normal.  Genitourinary:    Comments: Deferred Musculoskeletal:        General: Normal range of motion.     Cervical back: Normal range of motion.  Skin:    General: Skin is warm and dry.  Neurological:     General: No focal deficit present.     Mental Status: She is alert and oriented to person, place, and time. Mental status is at baseline.    Review of Systems  Constitutional:  Negative for chills, diaphoresis and fever.  HENT:  Negative for congestion and sore throat.   Eyes:  Negative for blurred vision.  Respiratory:  Negative for cough, shortness of breath and wheezing.   Cardiovascular:  Negative for chest pain and palpitations.  Gastrointestinal:  Negative for abdominal pain, constipation, diarrhea, heartburn, nausea and vomiting.  Genitourinary:  Negative for dysuria.  Musculoskeletal:  Negative for joint pain and myalgias.  Skin:  Negative for itching and rash.  Neurological:  Negative for dizziness, tingling, tremors, sensory change, speech change, focal  weakness, seizures, loss of consciousness, weakness and headaches.  Endo/Heme/Allergies:        Allergies: Amoxicillin,  Psychiatric/Behavioral:  Positive for hallucinations (Hx of (stable on medication).). Negative for depression, memory loss, substance abuse and suicidal ideas. The patient is not nervous/anxious and does not have insomnia.    Blood pressure 112/65, pulse 82, temperature 98.3 F (36.8 C), temperature source Oral, resp. rate 16, height 5\' 9"  (1.753 m), weight 110.5 kg, SpO2 100 %. Body mass index is 35.97 kg/m.   Social History   Tobacco Use  Smoking Status Never  Smokeless Tobacco Never   Tobacco Cessation:  N/A, patient does not currently use tobacco products  Blood Alcohol level:  Lab Results  Component Value Date   ETH <10 12/06/2022   ETH <10  11/01/2022   Metabolic Disorder Labs:  Lab Results  Component Value Date   HGBA1C 4.8 11/06/2022   MPG 91.06 11/06/2022   MPG 93.93 01/16/2018   Lab Results  Component Value Date   PROLACTIN 29.5 (H) 01/10/2018   Lab Results  Component Value Date   CHOL 118 11/05/2022   TRIG 59 11/05/2022   HDL 49 11/05/2022   CHOLHDL 2.4 11/05/2022   VLDL 12 11/05/2022   LDLCALC 57 11/05/2022   LDLCALC 55 01/16/2018   See Psychiatric Specialty Exam and Suicide Risk Assessment completed by Attending Physician prior to discharge.  Discharge destination:  Home  Is patient on multiple antipsychotic therapies at discharge:  No   Has Patient had three or more failed trials of antipsychotic monotherapy by history:  No  Recommended Plan for Multiple Antipsychotic Therapies: NA  Discharge Instructions     Diet - low sodium heart healthy   Complete by: As directed    Increase activity slowly   Complete by: As directed       Allergies as of 12/18/2022       Reactions   Amoxicillin Hives, Itching        Medication List     STOP taking these medications    sulfamethoxazole-trimethoprim 800-160 MG tablet Commonly known as: BACTRIM DS       TAKE these medications      Indication  hydrOXYzine 50 MG tablet Commonly known as: ATARAX Take 1 tablet (50 mg total) by mouth 3 (three) times daily as needed for anxiety. What changed:  medication strength how much to take  Indication: Feeling Anxious   LORazepam 0.5 MG tablet Commonly known as: ATIVAN Take 1 tablet (0.5 mg total) by mouth as directed for 10 doses. Taper off of your Ativan as follows: Continue Ativan 0.5 mg twice daily for 3 days.  Then decrease to 0.5 mg once daily for 4 days. Then stop.  Indication: Feeling Anxious   propranolol ER 60 MG 24 hr capsule Commonly known as: Inderal LA Take 1 capsule (60 mg total) by mouth daily.  Indication: Feeling Anxious   risperiDONE 1 MG tablet Commonly known as:  RISPERDAL Take 2 tablets (2 mg total) by mouth as directed for 6 doses. Taper off of your oral risperdal as follows: continue risperdal 2 mg at bedtime for 2 days. Then decrease the dose to 1 mg at bedtime for 2 days. Then stop oral risperdal. What changed:  medication strength how much to take when to take this additional instructions  Indication: Schizophrenia   Uzedy 100 MG/0.28ML Susy Generic drug: risperiDONE ER Inject 0.28 mLs (100 mg total) into the skin every 28 (twenty-eight) days for 1 dose. Next dose  is due on 01-15-23. Start taking on: January 13, 2023 What changed: You were already taking a medication with the same name, and this prescription was added. Make sure you understand how and when to take each.  Indication: Schizophrenia   sertraline 50 MG tablet Commonly known as: ZOLOFT Take 1 tablet (50 mg total) by mouth daily. Start taking on: December 19, 2022  Indication: Major Depressive Disorder   traZODone 100 MG tablet Commonly known as: DESYREL Take 1 tablet (100 mg total) by mouth at bedtime. What changed:  when to take this reasons to take this  Indication: Trouble Sleeping, Major Depressive Disorder        Follow-up Information     [employer] Go on 12/21/2022.   Why: You have an appointment for an assessment on 12/21/22 at 10:00 am.  This appointment will be held in person. Following this appointment you will be scheduled for a clinical assessment to obtain therapy and medication management services. Contact information: [address] [place] [address] [phone]         Services, Psychotherapeutic. Call.   Why: Your careteam is recommending services at [facility], a day program, for ongoing services.  In order to qualify for services, you will need to change your [facility] to [facility]  We offered to assist with this process and you declined stating that you did not want to switch your medicaid.  If you  change your mind, please reach out and see if program can assist with changing medicaid. Contact information: [address] [place] [address] [phone]                Follow-up recommendations: Activity:  As tolerated Diet: As recommended by your primary care doctor. Keep all scheduled follow-up appointments as recommended.   Comments: Comments: Patient is recommended to follow-up care on an outpatient basis as noted above. Prescriptions sent to pt's pharmacy of choice at discharge.   Patient agreeable to plan.   Given opportunity to ask questions.   Appears to feel comfortable with discharge denies any current suicidal or homicidal thought. Patient is also instructed prior to discharge to: Take all medications as prescribed by his/her mental healthcare provider. Report any adverse effects and or reactions from the medicines to his/her outpatient provider promptly. Patient has been instructed & cautioned: To not engage in alcohol and or illegal drug use while on prescription medicines. In the event of worsening symptoms, patient is instructed to call the crisis hotline, 911 and or go to the nearest ED for appropriate evaluation and treatment of symptoms. To follow-up with his/her primary care provider for your other medical issues, concerns and or health care needs.  Signed: Armandina Stammer, NP, pmhnp, fnp-bc. 12/18/2022, 9:28 AM

## 2022-12-18 NOTE — Group Note (Signed)
Date:  12/18/2022 Time:  1:37 PM  Group Topic/Focus:  Self Care:   The focus of this group is to help patients understand the importance of self-care in order to improve or restore emotional, physical, spiritual, interpersonal, and financial health.    Participation Level:  Active  Participation Quality:  Appropriate  Affect:  Appropriate  Cognitive:  Appropriate  Insight: Appropriate  Engagement in Group:  Engaged  Modes of Intervention:  Discussion  Additional Comments:  Patient attended Healthy Support System group and participated.  Danzell Birky W Zykira Matlack 12/18/2022, 1:37 PM

## 2023-03-09 ENCOUNTER — Ambulatory Visit: Payer: Self-pay | Admitting: Podiatry

## 2023-03-12 ENCOUNTER — Ambulatory Visit: Payer: Medicaid Other | Admitting: Podiatry
